# Patient Record
Sex: Female | Born: 1990 | Race: Black or African American | Hispanic: No | Marital: Single | Smoking: Never smoker
Health system: Southern US, Community
[De-identification: ages and names within clinical notes are randomized; demographics above are authoritative.]

## PROBLEM LIST (undated history)

## (undated) DIAGNOSIS — R569 Unspecified convulsions: Secondary | ICD-10-CM

## (undated) DIAGNOSIS — F410 Panic disorder [episodic paroxysmal anxiety] without agoraphobia: Secondary | ICD-10-CM

## (undated) HISTORY — DX: Panic disorder (episodic paroxysmal anxiety): F41.0

---
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
## 2009-05-28 ENCOUNTER — Emergency Department (HOSPITAL_COMMUNITY): Admission: EM | Admit: 2009-05-28 | Discharge: 2009-05-28 | Payer: Self-pay | Admitting: Emergency Medicine

## 2010-02-03 ENCOUNTER — Ambulatory Visit: Payer: Self-pay | Admitting: Family Medicine

## 2010-02-03 DIAGNOSIS — Z87898 Personal history of other specified conditions: Secondary | ICD-10-CM | POA: Insufficient documentation

## 2010-02-03 DIAGNOSIS — R634 Abnormal weight loss: Secondary | ICD-10-CM | POA: Insufficient documentation

## 2010-02-11 ENCOUNTER — Encounter: Payer: Self-pay | Admitting: Family Medicine

## 2010-02-13 ENCOUNTER — Encounter: Payer: Self-pay | Admitting: Family Medicine

## 2010-02-26 ENCOUNTER — Encounter: Payer: Self-pay | Admitting: Family Medicine

## 2010-03-05 LAB — CONVERTED CEMR LAB
BUN: 15 mg/dL (ref 6–23)
Basophils Absolute: 0 10*3/uL (ref 0.0–0.1)
CO2: 23 meq/L (ref 19–32)
Cholesterol: 143 mg/dL (ref 0–200)
Creatinine, Ser: 0.88 mg/dL (ref 0.40–1.20)
Eosinophils Absolute: 0 10*3/uL (ref 0.0–0.7)
Glucose, Bld: 78 mg/dL (ref 70–99)
HDL: 72 mg/dL (ref >39)
LDL Cholesterol: 61 mg/dL (ref 0–99)
Lymphs Abs: 1.6 10*3/uL (ref 0.7–4.0)
MCHC: 32.9 g/dL (ref 30.0–36.0)
MCV: 89.5 fL (ref 78.0–100.0)
Monocytes Absolute: 0.5 10*3/uL (ref 0.1–1.0)
Monocytes Relative: 13 % — ABNORMAL HIGH (ref 3–12)
Platelets: 159 10*3/uL (ref 150–400)
RBC: 4.75 M/uL (ref 3.87–5.11)
VLDL: 10 mg/dL (ref 0–40)
Vit D, 25-Hydroxy: 23 ng/mL — ABNORMAL LOW (ref 30–89)
WBC: 4.1 10*3/uL (ref 4.0–10.5)

## 2010-07-22 ENCOUNTER — Ambulatory Visit: Payer: Self-pay | Admitting: Family Medicine

## 2010-07-22 ENCOUNTER — Other Ambulatory Visit
Admission: RE | Admit: 2010-07-22 | Discharge: 2010-07-22 | Payer: Self-pay | Source: Home / Self Care | Admitting: Family Medicine

## 2010-07-22 DIAGNOSIS — N760 Acute vaginitis: Secondary | ICD-10-CM

## 2010-07-23 ENCOUNTER — Encounter: Payer: Self-pay | Admitting: Family Medicine

## 2010-07-24 ENCOUNTER — Encounter: Payer: Self-pay | Admitting: Family Medicine

## 2010-07-24 LAB — CONVERTED CEMR LAB: Pap Smear: NEGATIVE

## 2010-07-28 LAB — CONVERTED CEMR LAB
Chlamydia, DNA Probe: POSITIVE — AB
GC Probe Amp, Genital: NEGATIVE

## 2010-07-31 ENCOUNTER — Ambulatory Visit: Payer: Self-pay | Admitting: Family Medicine

## 2010-09-13 ENCOUNTER — Encounter: Payer: Self-pay | Admitting: Pediatrics

## 2010-09-22 NOTE — Assessment & Plan Note (Signed)
Summary: phy/slj   Vital Signs:  Patient profile:   20 year old female Menstrual status:  regular LMP:     06/25/2010 Height:      64.75 inches Weight:      111.25 pounds BMI:     18.72 O2 Sat:      98 % on Room air Pulse rate:   68 / minute Pulse rhythm:   regular Resp:     16 per minute BP sitting:   90 / 60  (left arm)  Vitals Entered By: Adella Hare LPN (July 22, 2010 1:06 PM)  O2 Flow:  Room air CC: physical Is Patient Diabetic? No Pain Assessment Patient in pain? no       Vision Screening:Left eye w/o correction: 20 / 20 Right Eye w/o correction: 20 / 20 Both eyes w/o correction:  20/ 20        Vision Entered By: Adella Hare LPN (July 22, 2010 2:04 PM) LMP (date): 06/25/2010     Menstrual Status regular Enter LMP: 06/25/2010   CC:  physical.  History of Present Illness: Reports  that she has been doing fairly well. Denies recent fever or chills. Denies sinus pressure, nasal congestion , ear pain or sore throat. Denies chest congestion, or cough productive of sputum. Denies chest pain, palpitations, PND, orthopnea or leg swelling. Denies abdominal pain, nausea, vomitting, diarrhea or constipation. Denies change in bowel movements or bloody stool. Denies dysuria , frequency, incontinence or hesitancy.c/o malodoros vag d/c and intermittent pelvic pain. Denies  joint pain, swelling, or reduced mobility. Denies headaches, vertigo, seizures. Denies depression, anxiety or insomnia. Denies  rash, lesions, or itch.     Current Medications (verified): 1)  Tri-Sprintec 0.18/0.215/0.25 Mg-35 Mcg Tabs (Norgestim-Eth Estrad Triphasic) .... Use As Directed  Allergies (verified): No Known Drug Allergies  Review of Systems      See HPI General:  Denies chills, fatigue, and fever. Eyes:  Denies blurring, discharge, double vision, eye pain, and red eye. GU:  Complains of discharge. Psych:  Denies anxiety and depression. Endo:  Denies cold  intolerance, excessive hunger, excessive thirst, and excessive urination. Heme:  Denies abnormal bruising, bleeding, enlarge lymph nodes, and fevers. Allergy:  Denies hives or rash, itching eyes, persistent infections, and seasonal allergies.  Physical Exam  General:  Well-developed,well-nourished,in no acute distress; alert,appropriate and cooperative throughout examination Head:  Normocephalic and atraumatic without obvious abnormalities. No apparent alopecia or balding. Eyes:  No corneal or conjunctival inflammation noted. EOMI. Perrla. Funduscopic exam benign, without hemorrhages, exudates or papilledema. Vision grossly normal. Ears:  External ear exam shows no significant lesions or deformities.  Otoscopic examination reveals clear canals, tympanic membranes are intact bilaterally without bulging, retraction, inflammation or discharge. Hearing is grossly normal bilaterally. Nose:  External nasal examination shows no deformity or inflammation. Nasal mucosa are pink and moist without lesions or exudates. Mouth:  Oral mucosa and oropharynx without lesions or exudates.  Teeth in good repair. Neck:  No deformities, masses, or tenderness noted. Chest Wall:  No deformities, masses, or tenderness noted. Breasts:  No mass, nodules, thickening, tenderness, bulging, retraction, inflamation, nipple discharge or skin changes noted.   Lungs:  Normal respiratory effort, chest expands symmetrically. Lungs are clear to auscultation, no crackles or wheezes. Heart:  Normal rate and regular rhythm. S1 and S2 normal without gallop, murmur, click, rub or other extra sounds. Abdomen:  Bowel sounds positive,abdomen soft and non-tender without masses, organomegaly or hernias noted. Genitalia:  Normal introitus for age, no external  lesions, foul smelling vaginal d/c, mucosa pink and moist, no vaginal or cervical lesions, no vaginal atrophy, no friaility or hemorrhage, normal uterus size and position, no adnexal masses  or tenderness Msk:  No deformity or scoliosis noted of thoracic or lumbar spine.   Pulses:  R and L carotid,radial,femoral,dorsalis pedis and posterior tibial pulses are full and equal bilaterally Extremities:  No clubbing, cyanosis, edema, or deformity noted with normal full range of motion of all joints.   Neurologic:  No cranial nerve deficits noted. Station and gait are normal. Plantar reflexes are down-going bilaterally. DTRs are symmetrical throughout. Sensory, motor and coordinative functions appear intact. Skin:  Intact without suspicious lesions or rashes Cervical Nodes:  No lymphadenopathy noted Axillary Nodes:  No palpable lymphadenopathy Inguinal Nodes:  No significant adenopathy Psych:  Cognition and judgment appear intact. Alert and cooperative with normal attention span and concentration. No apparent delusions, illusions, hallucinations   Impression & Recommendations:  Problem # 1:  UNSPECIFIED VAGINITIS AND VULVOVAGINITIS (ICD-616.10) Assessment Comment Only  Her updated medication list for this problem includes:    Doxycycline Hyclate 100 Mg Caps (Doxycycline hyclate) .Marland Kitchen... Take 1 capsule by mouth two times a day  Orders: T-Wet Prep by Molecular Probe [phone]) T-Chlamydia & GC Probe, Genital ([id number]) saf sex discussed and condom use, denies crrent activity  Problem # 2:  Preventive Health Care (ICD-V70.0) Assessment: Comment Only  contraception prescribed,pap sent  Complete Medication List: 1)  Tri-sprintec 0.18/0.215/0.25 Mg-35 Mcg Tabs (Norgestim-eth estrad triphasic) .... Use as directed 2)  Doxycycline Hyclate 100 Mg Caps (Doxycycline hyclate) .... Take 1 capsule by mouth two times a day  Other Orders: Pap Smear (66440) Tdap => 75yrs IM (34742) Admin 1st Vaccine (59563)  Patient Instructions: 1)  F/U 4 to 5 months  for gardasil #3 2)  It is important that you exercise regularly at least 20 minutes 5 times a week. If you develop chest pain,  have severe difficulty breathing, or feel very tired , stop exercising immediately and seek medical attention. 3)  If you could be exposed to sexually transmitted diseases, you should use a condom. 4)  If you are having sex and you or your partner don't want a child, use contraception. 5)  You will get gardasil #2  today, and tdAP  Prescriptions: DOXYCYCLINE HYCLATE 100 MG CAPS (DOXYCYCLINE HYCLATE) Take 1 capsule by mouth two times a day  #14 x 0   Entered and Authorized by:   Syliva Overman MD   Signed by:   Syliva Overman MD on 07/24/2010   Method used:   Historical   RxID:   [id number] TRI-SPRINTEC 0.18/0.215/0.25 MG-35 MCG TABS (NORGESTIM-ETH ESTRAD TRIPHASIC) Use as directed  #28 Tablet x 11   Entered by:   Adella Hare LPN   Authorized by:   Syliva Overman MD   Signed by:   Adella Hare LPN on 60/63/0160   Method used:   Electronically to        [facility] (retail)       [address]       [place], [place]  [id number]       Ph: [phone]       Fax: [phone]   RxID:   [id number]    Orders Added: 1)  Est. Patient 18-39 years [99395] 2)  T-Wet Prep by Molecular Probe [phone] 3)  T-Chlamydia & GC Probe, Genital [87491/[id number]] 4)  Pap Smear [88150] 5)  Tdap => 73yrs IM [90715] 6)  Admin 1st Vaccine [25366]   Immunizations Administered:  Tetanus Vaccine:    Vaccine Type: Tdap    Site: right deltoid    Mfr: GlaxoSmithKline    Dose: 0.5 ml    Route: IM    Given by: Adella Hare LPN    Exp. Date: 06/11/2012    Lot #: [id number]    VIS given: 07/10/08 version given July 22, 2010.   Immunizations Administered:  Tetanus Vaccine:    Vaccine Type: Tdap    Site: right deltoid    Mfr: GlaxoSmithKline    Dose: 0.5 ml    Route: IM    Given by: Adella Hare LPN    Exp. Date: 06/11/2012    Lot #: [id number]    VIS given: 07/10/08 version given July 22, 2010.

## 2010-09-22 NOTE — Letter (Signed)
Summary: medical release  medical release   Imported By: Lind Guest 02/04/2010 09:11:11  _____________________________________________________________________  External Attachment:    Type:   Image     Comment:   External Document

## 2010-09-22 NOTE — Progress Notes (Signed)
[facility]   Imported By: Lind Guest 03/04/2010 08:06:26  _____________________________________________________________________  External Attachment:    Type:   Image     Comment:   External Document

## 2010-09-22 NOTE — Letter (Signed)
Summary: [facility]  [facility]   Imported By: Lind Guest 02/13/2010 10:14:56  _____________________________________________________________________  External Attachment:    Type:   Image     Comment:   External Document

## 2010-09-22 NOTE — Progress Notes (Signed)
Summary: DR. Ollen Barges  DR. Ollen Barges   Imported By: Lind Guest 02/10/2010 08:53:13  _____________________________________________________________________  External Attachment:    Type:   Image     Comment:   External Document

## 2010-09-22 NOTE — Letter (Signed)
Summary: Work Excuse  [facility]  [address]   [place], [place] [address]   Phone: [phone]  Fax: [phone]    Today's Date: July 22, 2010  Name of Patient: Mariah Fisher  The above named patient had a medical visit today at:  am / pm.  Please take this into consideration when reviewing the time away from work/school.    Special Instructions:  [ * ] None  [  ] To be off the remainder of today, returning to the normal work / school schedule tomorrow.  [  ] To be off until the next scheduled appointment on ______________________.  [  ] Other ________________________________________________________________ ________________________________________________________________________   Sincerely yours,   Milus Mallick. Lodema Hong, MD

## 2010-09-22 NOTE — Assessment & Plan Note (Signed)
Summary: NEW PATIENT   Vital Signs:  Patient profile:   20 year old female Height:      64.75 inches Weight:      106.75 pounds BMI:     17.97 Pulse rate:   61 / minute Pulse rhythm:   regular Resp:     16 per minute BP sitting:   90 / 60  (left arm)  Vitals Entered By: Adella Hare LPN (February 03, 2010 11:13 AM) CC: new patient Is Patient Diabetic? No Pain Assessment Patient in pain? no        CC:  new patient.  History of Present Illness: This is  a new pt evaluation for this young lady who reports 2 syncopal episodes in the past 1 month, once was while drawing blood, and the other time was on the job when she asked for a break to eat having not eaten for 8 hrs , she went to the bathroom and passed out. In the past she has had full neurology eval for seizures was started on paxil and reportedly became suicidal on the drug and thereafter discontinued it. This was in 2009, no other seizure activity since, but has a dx from neurologuy of gAD, though she denies current symptoms  Poor apertite with weight loss noted by her mother who accmpanies her. She denies abdominal apin , nausea, vommiting diarreah or constipation. she denies any recent fever or chills, head or chest congestion, dysuria or frequency.   Preventive Screening-Counseling & Management  Alcohol-Tobacco     Smoking Status: never  Caffeine-Diet-Exercise     Does Patient Exercise: no      Drug Use:  no.    Current Medications (verified): 1)  None  Allergies (verified): No Known Drug Allergies  Past History:  Past Medical History: panic disorder  started at age 32  Past Surgical History: none  Family History: mother- htn father- no history brother and sister- asthma  Social History: college in the fall- undecided unemployed single no dependents Never Smoked Alcohol use-no Drug use-no Regular exercise-no Smoking Status:  never Drug Use:  no Does Patient Exercise:  no  Review of  Systems      See HPI General:  Complains of fatigue and weight loss; denies chills and fever. Eyes:  Denies blurring and discharge. ENT:  Denies hoarseness, nasal congestion, sinus pressure, and sore throat. CV:  Complains of fatigue and lightheadness; denies chest pain or discomfort, palpitations, shortness of breath with exertion, and swelling of feet. Resp:  Denies cough, sputum productive, and wheezing. GI:  Complains of loss of appetite; denies abdominal pain, constipation, diarrhea, nausea, and vomiting. GU:  Denies abnormal vaginal bleeding, discharge, dysuria, and urinary frequency; needs to resume contraceptives as well as needs pap. MS:  Denies joint pain and stiffness. Derm:  Denies itching and rash. Neuro:  Denies headaches, poor balance, and tremors. Psych:  Complains of anxiety; denies depression; h/o GAD/panic, denies current symptoms. Endo:  Complains of weight change; denies excessive thirst, excessive urination, and heat intolerance. Heme:  Denies abnormal bruising and bleeding. Allergy:  Denies hives or rash and itching eyes.  Physical Exam  General:  Well-developed,adequately nourished,in no acute distress; alert,appropriate and cooperative throughout examination HEENT: No facial asymmetry,  EOMI, No sinus tenderness, TM's Clear, oropharynx  pink and moist.   Chest: Clear to auscultation bilaterally.  CVS: S1, S2, No murmurs, No S3.   Abd: Soft, Nontender.  MS: Adequate ROM spine, hips, shoulders and knees.  Ext: No edema.  CNS: CN 2-12 intact, power tone and sensation normal throughout.   Skin: Intact, no visible lesions or rashes.  Psych: Good eye contact, normal affect.  Memory intact, not anxious or depressed appearing.    Impression & Recommendations:  Problem # 1:  WEIGHT LOSS (ICD-783.21) Assessment Comment Only  Orders: T-Basic Metabolic Panel [phone]) T-CBC w/Diff [phone]) T-TSH [phone]) T-Vitamin D (25-Hydroxy) [phone]) pt  referred for nutritional counselling re impt of healthy diet and good nutrition  Problem # 2:  SYNCOPE (ICD-780.2) Assessment: Comment Only  Orders: T-CBC w/Diff ([id number]) Cardiology Referral (Cardiology), one episode is vasovagal by history , and the 2nd likely related to hypoglycemia, pt referred for basic card eval, exaM IN THE OFFICE IS NORMAL, NO VALVULAR ABNORMALITIES HEARD ON EXAM  Problem # 3:  Preventive Health Care (ICD-V70.0) Assessment: Comment Only Gardasil #1 administered. Counselled re use of condoms and contraception , and script provided.  Complete Medication List: 1)  Tri-sprintec 0.18/0.215/0.25 Mg-35 Mcg Tabs (Norgestim-eth estrad triphasic) .... Use as directed  Other Orders: T-Lipid Profile ([id number]) HPV Vaccine - 3 sched doses - IM (59563) Admin 1st Vaccine (87564) Admin 1st Vaccine [facility]) [phone])  Patient Instructions: 1)  cPE in  end August or early sept fopr pap. 2)  BMP prior to visit, ICD-9: 3)  Lipid Panel prior to visit, ICD-9:   fasting asap 4)  TSH prior to visit, ICD-9: 5)  CBC w/ Diff prior to visit, ICD-9: 6)  vit D 7)  You will be referred to cardiology as well as for nutrtional counselling  8)  If you could be exposed to sexually transmitted diseases, you should use a condom. 9)  If you are having sex and you or your partner don't want a child, use contraception. Prescriptions: TRI-SPRINTEC 0.18/0.215/0.25 MG-35 MCG TABS (NORGESTIM-ETH ESTRAD TRIPHASIC) Use as directed  #1 x 3   Entered by:   Adella Hare LPN   Authorized by:   Syliva Overman MD   Signed by:   Adella Hare LPN on 88/41/6606   Method used:   Electronically to        [facility] (retail)       [address]       [place], [place]  [id number]       Ph: [phone]       Fax: [phone]   RxID:   [id number] TRI-SPRINTEC 0.18/0.215/0.25 MG-35 MCG TABS (NORGESTIM-ETH ESTRAD TRIPHASIC) Use as directed  #1 x 3   Entered and  Authorized by:   Syliva Overman MD   Signed by:   Syliva Overman MD on 02/03/2010   Method used:   Printed then faxed to ...         RxID:   [id number]    HPV # 1    Vaccine Type: Gardasil    Site: right deltoid    Mfr: Merck    Dose: 0.5 ml    Route: IM    Given by: Adella Hare LPN    Exp. Date: 06/06/2011    Lot #: [id number]    VIS given: 09/24/05 version given February 03, 2010.

## 2010-09-22 NOTE — Letter (Signed)
Summary: Pap Smear, Normal Letter, [facility]  [address]   [place], [place] [address]   Phone: [phone]  Fax: [phone]          July 24, 2010    Dear: Estelle Grumbles    I am pleased to notify you that your PAP smear was normal.  You will need your next PAP smear in:     ____ 3 Months    ____ 6 Months    ____ 12 Months    Please contact our office for results of other testing done at your last office visit.    Sincerely,     [facility]

## 2010-09-22 NOTE — Letter (Signed)
Summary: communicable disease branch  communicable disease branch   Imported By: Lind Guest 07/31/2010 16:56:26  _____________________________________________________________________  External Attachment:    Type:   Image     Comment:   External Document

## 2010-09-24 NOTE — Assessment & Plan Note (Signed)
Summary: Kirstie Mirza.INJ  Nurse Visit   Vital Signs:  Patient profile:   20 year old female Menstrual status:  regular Height:      64.75 inches Weight:      113.25 pounds BP sitting:   98 / 68  Vitals Entered By: Adella Hare LPN (July 30, 2010 3:11 PM)   Allergies: No Known Drug Allergies  Immunizations Administered:  HPV # 2:    Vaccine Type: Gardasil    Site: right deltoid    Mfr: Merck    Dose: 0.5 ml    Route: IM    Given by: Adella Hare LPN    Exp. Date: 02/10/2013    Lot #: [id number]    VIS given: 12/23/09 version given July 30, 2010.  Orders Added: 1)  HPV Vaccine - 3 sched doses - IM [90649] 2)  Admin 1st Vaccine [90471]  gardasil given with no adverse side efects

## 2010-10-15 ENCOUNTER — Encounter: Payer: Self-pay | Admitting: Family Medicine

## 2010-10-15 ENCOUNTER — Ambulatory Visit (INDEPENDENT_AMBULATORY_CARE_PROVIDER_SITE_OTHER): Payer: BC Managed Care – PPO | Admitting: Family Medicine

## 2010-10-15 DIAGNOSIS — R634 Abnormal weight loss: Secondary | ICD-10-CM

## 2010-10-15 DIAGNOSIS — R55 Syncope and collapse: Secondary | ICD-10-CM

## 2010-10-15 DIAGNOSIS — Z23 Encounter for immunization: Secondary | ICD-10-CM

## 2010-10-16 ENCOUNTER — Telehealth: Payer: Self-pay | Admitting: Family Medicine

## 2010-10-20 ENCOUNTER — Other Ambulatory Visit: Payer: Self-pay | Admitting: Family Medicine

## 2010-10-20 DIAGNOSIS — R55 Syncope and collapse: Secondary | ICD-10-CM

## 2010-10-20 NOTE — Letter (Signed)
Summary: release of medical information  release of medical information   Imported By: Lind Guest 10/15/2010 10:28:08  _____________________________________________________________________  External Attachment:    Type:   Image     Comment:   External Document

## 2010-10-20 NOTE — Progress Notes (Signed)
  Phone Note Call from Patient   Caller: Patient Summary of Call: patient states she wants a refill on the antibiotic you gave her a while back, states she did not take as prescribed and wants to ensure she is okay Initial call taken by: Adella Hare LPN,  October 16, 2010 9:48 AM  Follow-up for Phone Call        please notify the patient  of the medication  The new script is entered historically, please send after speaking with the patient.  Follow-up by: Syliva Overman MD,  October 16, 2010 3:29 PM  Additional Follow-up for Phone Call Additional follow up Details #1::        med sent, patient aware Additional Follow-up by: Adella Hare LPN,  October 16, 2010 4:04 PM    New/Updated Medications: DOXYCYCLINE HYCLATE 100 MG TABS (DOXYCYCLINE HYCLATE) Take 1 tablet by mouth two times a day Prescriptions: DOXYCYCLINE HYCLATE 100 MG TABS (DOXYCYCLINE HYCLATE) Take 1 tablet by mouth two times a day  #14 x 0   Entered by:   Adella Hare LPN   Authorized by:   Syliva Overman MD   Signed by:   Adella Hare LPN on 16/05/9603   Method used:   Electronically to        [facility] (retail)       [address]       [place], [place]  [id number]       Ph: [phone]       Fax: [phone]   RxID:   [id number] DOXYCYCLINE HYCLATE 100 MG TABS (DOXYCYCLINE HYCLATE) Take 1 tablet by mouth two times a day  #14 x 0   Entered and Authorized by:   Syliva Overman MD   Signed by:   Syliva Overman MD on 10/16/2010   Method used:   Historical   RxID:   [id number]

## 2010-10-23 ENCOUNTER — Ambulatory Visit (HOSPITAL_COMMUNITY): Admission: RE | Admit: 2010-10-23 | Payer: BC Managed Care – PPO | Source: Ambulatory Visit

## 2010-10-29 ENCOUNTER — Encounter: Payer: Self-pay | Admitting: Family Medicine

## 2010-10-29 NOTE — Assessment & Plan Note (Signed)
Summary: office visit needs to sign paper for mother   Vital Signs:  Patient profile:   20 year old female Menstrual status:  regular Height:      64.5 inches Weight:      109 pounds BMI:     18.49 O2 Sat:      98 % Pulse rate:   58 / minute Pulse rhythm:   regular Resp:     16 per minute BP sitting:   98 / 64  (left arm) Cuff size:   regular  Vitals Entered By: Everitt Amber LPN (October 15, 2010 10:01 AM) CC: Follow up visit, c/o head and neck hurting since she fell Monday during a panic attack   Vision Screening:Left eye w/o correction: 20 / 20 Right Eye w/o correction: 20 / 25 Both eyes w/o correction:  20/ 20        Vision Entered By: Adella Hare LPN (October 16, 2010 9:46 AM)   CC:  Follow up visit and c/o head and neck hurting since she fell Monday during a panic attack .  History of Present Illness: Passed out 3 days ago on the job, states she had eaten  feel to the floor eyes rolling , states she was jerking  and she was sweaty after, this was witnessed, no incontionenec of stool or urine, States she had an "aura", lost feeling in both legs. States she is having blurred vision in the past 3 days. now experiencing head and neck pain. She states she has been eaing regularly. she has been evauatedby cardiology in the past 6months for this complaint and this was negative.  Current Medications (verified): 1)  Tri-Sprintec 0.18/0.215/0.25 Mg-35 Mcg Tabs (Norgestim-Eth Estrad Triphasic) .... Use As Directed  Allergies (verified): No Known Drug Allergies  Review of Systems      See HPI General:  Complains of weakness and weight loss; denies chills, fatigue, and fever. Eyes:  Complains of blurring; denies discharge and itching. ENT:  Denies hoarseness and nasal congestion. CV:  Denies chest pain or discomfort, palpitations, and swelling of feet. Resp:  Denies cough and sputum productive. GI:  Denies abdominal pain, constipation, diarrhea, nausea, and  vomiting. GU:  Denies dysuria and urinary frequency. MS:  Denies joint pain and stiffness. Derm:  Denies itching and rash. Neuro:  Complains of falling down, headaches, and weakness; denies inability to speak, memory loss, numbness, poor balance, and sensation of room spinning. Endo:  Denies cold intolerance and excessive hunger. Heme:  Denies abnormal bruising and bleeding. Allergy:  Denies hives or rash and itching eyes.  Physical Exam  General:  Well-developed,well-nourished,in no acute distress; alert,appropriate and cooperative throughout examination HEENT: No facial asymmetry,  EOMI, No sinus tenderness, TM's Clear, oropharynx  pink and moist.   Chest: Clear to auscultation bilaterally.  CVS: S1, S2, No murmurs, No S3.   Abd: Soft, Nontender.  MS: Adequate ROM spine, hips, shoulders and knees.  Ext: No edema.   CNS: CN 2-12 intact, power tone and sensation normal throughout.   Skin: Intact, no visible lesions or rashes.  Psych: Good eye contact, normal affect.  Memory intact, not anxious or depressed appearing.    Impression & Recommendations:  Problem # 1:  SYNCOPE (ICD-780.2) Assessment Comment Only  Orders: Neurology Referral (Neuro)Future Orders: Radiology Referral (Radiology) ... 10/20/2010  Problem # 2:  WEIGHT LOSS (ICD-783.21) Assessment: Deteriorated Pt encouraged to eat regularly, and increase her caloric intake, since she has lost weight  Complete Medication List: 1)  Tri-sprintec  0.18/0.215/0.25 Mg-35 Mcg Tabs (Norgestim-eth estrad triphasic) .... Use as directed  Other Orders: HPV Vaccine - 3 sched doses - IM (78295) Admin 1st Vaccine (62130)  Patient Instructions: 1)  Please schedule a follow-up appointment in 4 months. 2)  You are being referred to neurologist as well as for a brain scan. 3)  Vision screen is normal   Orders Added: 1)  Est. Patient Level IV [86578] 2)  Neurology Referral [Neuro] 3)  HPV Vaccine - 3 sched doses - IM  [90649] 4)  Admin 1st Vaccine [90471] 5)  Radiology Referral [Radiology]   Immunizations Administered:  HPV # 3:    Vaccine Type: Gardasil    Site: right deltoid    Mfr: Merck    Dose: 0.5 ml    Route: IM    Given by: Adella Hare LPN    Exp. Date: 02/10/2013    Lot #: [id number]    VIS given: 12/23/09 version given October 15, 2010.   Immunizations Administered:  HPV # 3:    Vaccine Type: Gardasil    Site: right deltoid    Mfr: Merck    Dose: 0.5 ml    Route: IM    Given by: Adella Hare LPN    Exp. Date: 02/10/2013    Lot #: [id number]    VIS given: 12/23/09 version given October 15, 2010.

## 2011-01-05 NOTE — Procedures (Signed)
EEG NUMBER:  [id number].   HISTORY:  The patient is a 20 year old with episodes of body and head  twitching with weakness.  She knows what is going on but is unable to  respond.  She can hear people talking to her.  The study is being done  to look for seizures versus anxiety attacks.  The patient had an event  during the evaluation, which started after she began to hyperventilate.  (604.54,098.0)   PROCEDURE:  The tracing is carried out on a 32-channel digital Cadwell  recorder reformatted into 16 channel montages with one devoted to EKG.  The patient was awake and alert during the recording.  Medications  include Ativan.  The International 10/20 system of lead placement was  used.   DESCRIPTION OF FINDINGS:  The dominant frequency is a 11-Hz, 15-  microvolt activity that is well-regulated.  Background activity is 20 20  Hz, under 10-microvolt beta-range activity.   Photic stimulation induced a driving response at 1, 3 and 7-17 Hz.  Hyperventilation was started and caused little change but then the  patient began experience twitching of her fingers which increased  amplitude, then her head began to twitch, her mouth opened.  She was  asked to grip and gave poor effort.  She said it was hard to lift both  legs and complained that they were heavy and her head felt heavy.  This  all occurred during a time when the background EEG was normal.   EKG showed a regular sinus rhythm with ventricular response of 54 beats  per minute.   IMPRESSION:  Normal waking record.  Behaviors are nonepileptic in  nature.      Mariah Fisher. Sharene Skeans, M.D.  Electronically Signed     [id number]  D:  05/04/2007 17:39:38  T:  05/05/2007 12:01:47  Job #:  [id number]

## 2011-02-09 ENCOUNTER — Encounter: Payer: Self-pay | Admitting: Family Medicine

## 2011-02-11 ENCOUNTER — Encounter: Payer: Self-pay | Admitting: Family Medicine

## 2011-02-11 ENCOUNTER — Ambulatory Visit: Payer: BC Managed Care – PPO | Admitting: Family Medicine

## 2011-04-16 ENCOUNTER — Encounter: Payer: Self-pay | Admitting: Family Medicine

## 2011-04-19 ENCOUNTER — Ambulatory Visit (INDEPENDENT_AMBULATORY_CARE_PROVIDER_SITE_OTHER): Payer: BC Managed Care – PPO | Admitting: Family Medicine

## 2011-04-19 ENCOUNTER — Encounter: Payer: Self-pay | Admitting: Family Medicine

## 2011-04-19 VITALS — BP 94/60 | HR 61 | Ht 64.75 in | Wt 109.0 lb

## 2011-04-19 DIAGNOSIS — R636 Underweight: Secondary | ICD-10-CM

## 2011-04-19 DIAGNOSIS — Z13220 Encounter for screening for lipoid disorders: Secondary | ICD-10-CM

## 2011-04-19 DIAGNOSIS — R5383 Other fatigue: Secondary | ICD-10-CM

## 2011-04-19 DIAGNOSIS — R634 Abnormal weight loss: Secondary | ICD-10-CM

## 2011-04-19 DIAGNOSIS — E559 Vitamin D deficiency, unspecified: Secondary | ICD-10-CM

## 2011-04-19 DIAGNOSIS — R55 Syncope and collapse: Secondary | ICD-10-CM

## 2011-04-19 MED ORDER — VITAMIN D3 1.25 MG (50000 UT) PO CAPS: 50000.0000 [IU] | ORAL_CAPSULE | ORAL | Status: DC

## 2011-04-19 NOTE — Patient Instructions (Signed)
CPE in 3 months.  pls commit to eating breakfast daily, eg 2 cups yogurt.  Start vit D capsules  LABWORK  NEEDS TO BE DONE BETWEEN 3 TO 7 DAYS BEFORE YOUR NEXT SCEDULED  VISIT.  THIS WILL IMPROVE THE QUALITY OF YOUR CARE.

## 2011-05-01 NOTE — Assessment & Plan Note (Signed)
Still has poor eating habits and has not gained weight, counseled re the importance of making changes to improve health

## 2011-05-01 NOTE — Progress Notes (Signed)
  Subjective:    Patient ID: Mariah Fisher, female    DOB: Nov 09, 1990, 20 y.o.   MRN: [id number]  HPI The PT is here for follow up and re-evaluation of chronic medical conditions, medication management and review of any available recent lab and radiology data.  Preventive health is updated, specifically  Cancer screening and Immunization.   Questions or concerns regarding consultations or procedures which the PT has had in the interim are  addressed. The PT denies any adverse reactions to current medications since the last visit.  There are no new concerns.  There are no specific complaints      Review of Systems Denies recent fever or chills. Denies sinus pressure, nasal congestion, ear pain or sore throat. Denies chest congestion, productive cough or wheezing. Denies chest pains, palpitations and leg swelling Denies abdominal pain, nausea, vomiting,diarrhea or constipation.   Denies dysuria, frequency, hesitancy or incontinence. Denies joint pain, swelling and limitation in mobility. Denies headaches, seizures, numbness, or tingling. Denies depression, anxiety or insomnia. Denies skin break down or rash.        Objective:   Physical Exam Patient alert and oriented and in no cardiopulmonary distress.  HEENT: No facial asymmetry, EOMI, no sinus tenderness,  oropharynx pink and moist.  Neck supple no adenopathy.  Chest: Clear to auscultation bilaterally.  CVS: S1, S2 no murmurs, no S3.  ABD: Soft non tender. Bowel sounds normal.  Ext: No edema  MS: Adequate ROM spine, shoulders, hips and knees.  Skin: Intact, no ulcerations or rash noted.  Psych: Good eye contact, normal affect. Memory intact not anxious or depressed appearing.  CNS: CN 2-12 intact, power, tone and sensation normal throughout.        Assessment & Plan:

## 2011-05-01 NOTE — Assessment & Plan Note (Signed)
No episode since last visit

## 2011-05-01 NOTE — Assessment & Plan Note (Signed)
Needs prescription med and will rept labs prior to next visit

## 2011-07-12 ENCOUNTER — Encounter: Payer: Self-pay | Admitting: Family Medicine

## 2011-07-22 ENCOUNTER — Encounter: Payer: Self-pay | Admitting: Family Medicine

## 2011-07-22 ENCOUNTER — Encounter: Payer: BC Managed Care – PPO | Admitting: Family Medicine

## 2011-11-03 ENCOUNTER — Other Ambulatory Visit: Payer: Self-pay | Admitting: Family Medicine

## 2012-03-03 ENCOUNTER — Encounter: Payer: Self-pay | Admitting: Family Medicine

## 2012-03-03 ENCOUNTER — Other Ambulatory Visit (HOSPITAL_COMMUNITY)
Admission: RE | Admit: 2012-03-03 | Discharge: 2012-03-03 | Disposition: A | Discharge status: Home / Self Care | Payer: BC Managed Care – PPO | Source: Ambulatory Visit | Attending: Family Medicine | Admitting: Family Medicine

## 2012-03-03 ENCOUNTER — Ambulatory Visit (INDEPENDENT_AMBULATORY_CARE_PROVIDER_SITE_OTHER): Payer: BC Managed Care – PPO | Admitting: Family Medicine

## 2012-03-03 VITALS — BP 110/60 | HR 85 | Resp 16 | Ht 64.75 in | Wt 110.4 lb

## 2012-03-03 DIAGNOSIS — N760 Acute vaginitis: Secondary | ICD-10-CM

## 2012-03-03 DIAGNOSIS — Z0000 Encounter for general adult medical examination without abnormal findings: Secondary | ICD-10-CM

## 2012-03-03 DIAGNOSIS — Z01419 Encounter for gynecological examination (general) (routine) without abnormal findings: Secondary | ICD-10-CM

## 2012-03-03 DIAGNOSIS — Z1159 Encounter for screening for other viral diseases: Secondary | ICD-10-CM | POA: Insufficient documentation

## 2012-03-03 MED ORDER — NORGESTIM-ETH ESTRAD TRIPHASIC 0.18/0.215/0.25 MG-35 MCG PO TABS: ORAL_TABLET | ORAL | Status: DC

## 2012-03-03 NOTE — Patient Instructions (Addendum)
CPE in 12 month  Pap  And swabs today.  Practice safe sex with condoms.   Use seat belts all the time  It is important that you exercise regularly at least 30 minutes 5 times a week. If you develop chest pain, have severe difficulty breathing, or feel very tired, stop exercising immediately and seek medical attention    A healthy diet is rich in fruit, vegetables and whole grains. Poultry fish, nuts and beans are a healthy choice for protein rather then red meat. A low sodium diet and drinking 64 ounces of water daily is generally recommended. Oils and sweet should be limited.

## 2012-03-04 DIAGNOSIS — Z0000 Encounter for general adult medical examination without abnormal findings: Secondary | ICD-10-CM | POA: Insufficient documentation

## 2012-03-04 NOTE — Assessment & Plan Note (Signed)
The importance of safe sex, with condom use, and contraception discussed and encouraged  Healthy diet and regular physical activity encouraged

## 2012-03-04 NOTE — Assessment & Plan Note (Signed)
Swabs sent will treat based on resiult

## 2012-03-04 NOTE — Progress Notes (Signed)
  Subjective:    Patient ID: Mariah Fisher, female    DOB: 1991/03/05, 21 y.o.   MRN: [id number]  HPI The PT is here for annual exam and re-evaluation of chronic medical conditions, medication management and review of any available recent lab and radiology data.  Preventive health is updated, specifically  Cancer screening and Immunization.   Questions or concerns regarding consultations or procedures which the PT has had in the interim are  Addressed.Recently treated for pharyngitis in urgent care approximately 3 weeks ago The PT denies any adverse reactions to current medications since the last visit.  There are no new concerns. Needs contraceptive pills. There are no specific complaints       Review of Systems See HPI Denies recent fever or chills. Denies sinus pressure, nasal congestion, ear pain or sore throat. Denies chest congestion, productive cough or wheezing. Denies chest pains, palpitations and leg swelling Denies abdominal pain, nausea, vomiting,diarrhea or constipation.   Denies dysuria, frequency, hesitancy or incontinence.c/o vaginal d/c Denies joint pain, swelling and limitation in mobility. Denies headaches, seizures, numbness, or tingling. Denies depression, anxiety or insomnia. Denies skin break down or rash.        Objective:   Physical Exam Pleasant well nourished female, alert and oriented x 3, in no cardio-pulmonary distress. Afebrile. HEENT No facial trauma or asymetry. Sinuses non tender.  EOMI, PERTL, fundoscopic exam is normal, no hemorhage or exudate.  External ears normal, tympanic membranes clear. Oropharynx moist, no exudate, fair  dentition. Neck: supple, no adenopathy,JVD or thyromegaly.No bruits.  Chest: Clear to ascultation bilaterally.No crackles or wheezes. Non tender to palpation  Breast: No asymetry,no masses. No nipple discharge or inversion. No axillary or supraclavicular adenopathy  Cardiovascular system; Heart sounds  normal,  S1 and  S2 ,no S3.  No murmur, or thrill. Apical beat not displaced Peripheral pulses normal.  Abdomen: Soft, non tender, no organomegaly or masses. No bruits. Bowel sounds normal. No guarding, tenderness or rebound.   GU: External genitalia normal. No lesions. Vaginal canal normal.No discharge. Uterus normal size, no adnexal masses, no cervical motion or adnexal tenderness.  Musculoskeletal exam: Full ROM of spine, hips , shoulders and knees. No deformity ,swelling or crepitus noted. No muscle wasting or atrophy.   Neurologic: Cranial nerves 2 to 12 intact. Power, tone ,sensation and reflexes normal throughout. No disturbance in gait. No tremor.  Skin: Intact, no ulceration, erythema , scaling or rash noted. Pigmentation normal throughout  Psych; Normal mood and affect. Judgement and concentration normal        Assessment & Plan:

## 2012-03-07 LAB — WET PREP BY MOLECULAR PROBE
Candida species: NEGATIVE
Trichomonas vaginosis: NEGATIVE

## 2012-03-07 LAB — GC/CHLAMYDIA PROBE AMP, GENITAL: GC Probe Amp, Genital: NEGATIVE

## 2012-03-16 ENCOUNTER — Other Ambulatory Visit: Payer: Self-pay

## 2012-03-16 MED ORDER — METRONIDAZOLE 500 MG PO TABS: 500.0000 mg | ORAL_TABLET | Freq: Two times a day (BID) | ORAL | Status: AC

## 2012-03-31 ENCOUNTER — Emergency Department (HOSPITAL_COMMUNITY): Payer: BC Managed Care – PPO

## 2012-03-31 ENCOUNTER — Emergency Department (HOSPITAL_COMMUNITY)
Admission: EM | Admit: 2012-03-31 | Discharge: 2012-03-31 | Disposition: A | Discharge status: Home / Self Care | Payer: BC Managed Care – PPO | Attending: Emergency Medicine | Admitting: Emergency Medicine

## 2012-03-31 ENCOUNTER — Encounter (HOSPITAL_COMMUNITY): Payer: Self-pay | Admitting: Emergency Medicine

## 2012-03-31 DIAGNOSIS — S4980XA Other specified injuries of shoulder and upper arm, unspecified arm, initial encounter: Secondary | ICD-10-CM | POA: Insufficient documentation

## 2012-03-31 DIAGNOSIS — S46909A Unspecified injury of unspecified muscle, fascia and tendon at shoulder and upper arm level, unspecified arm, initial encounter: Secondary | ICD-10-CM | POA: Insufficient documentation

## 2012-03-31 MED ORDER — LORAZEPAM 0.5 MG PO TABS: ORAL_TABLET | ORAL | Status: AC

## 2012-03-31 MED ORDER — IBUPROFEN 200 MG PO TABS
600.0000 mg | ORAL_TABLET | Freq: Once | ORAL | Status: AC
  Administered 2012-03-31: 600 mg via ORAL
  Filled 2012-03-31: qty 3

## 2012-03-31 MED ORDER — TETANUS-DIPHTH-ACELL PERTUSSIS 5-2.5-18.5 LF-MCG/0.5 IM SUSP
0.5000 mL | Freq: Once | INTRAMUSCULAR | Status: AC
  Administered 2012-03-31: 0.5 mL via INTRAMUSCULAR
  Filled 2012-03-31: qty 0.5

## 2012-03-31 MED ORDER — LORAZEPAM 1 MG PO TABS
1.0000 mg | ORAL_TABLET | Freq: Once | ORAL | Status: AC
  Administered 2012-03-31: 1 mg via ORAL
  Filled 2012-03-31: qty 1

## 2012-03-31 MED ORDER — IBUPROFEN 800 MG PO TABS: 800.0000 mg | ORAL_TABLET | Freq: Three times a day (TID) | ORAL | Status: AC

## 2012-03-31 MED ORDER — HYDROCODONE-ACETAMINOPHEN 5-325 MG PO TABS: 1.0000 | ORAL_TABLET | Freq: Once | ORAL | Status: DC
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
## 2012-03-31 NOTE — ED Provider Notes (Signed)
Medical screening examination/treatment/procedure(s) were performed by non-physician practitioner and as supervising physician I was immediately available for consultation/collaboration.  Sunnie Nielsen, MD 03/31/12 [phone]
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
## 2012-05-01 ENCOUNTER — Ambulatory Visit: Payer: BC Managed Care – PPO | Admitting: Family Medicine

## 2012-05-22 ENCOUNTER — Ambulatory Visit: Payer: BC Managed Care – PPO | Admitting: Family Medicine

## 2012-08-28 ENCOUNTER — Encounter: Payer: BC Managed Care – PPO | Admitting: Family Medicine

## 2013-01-16 ENCOUNTER — Telehealth: Payer: Self-pay | Admitting: Family Medicine

## 2013-01-16 ENCOUNTER — Other Ambulatory Visit: Payer: Self-pay

## 2013-01-16 MED ORDER — NORGESTIM-ETH ESTRAD TRIPHASIC 0.18/0.215/0.25 MG-35 MCG PO TABS: ORAL_TABLET | ORAL | Status: DC

## 2013-01-16 NOTE — Telephone Encounter (Signed)
Med refilled.

## 2013-03-02 ENCOUNTER — Encounter: Payer: BC Managed Care – PPO | Admitting: Family Medicine

## 2013-03-06 ENCOUNTER — Encounter: Payer: BC Managed Care – PPO | Admitting: Family Medicine

## 2014-01-31 ENCOUNTER — Other Ambulatory Visit: Payer: Self-pay | Admitting: Family Medicine

## 2014-06-10 ENCOUNTER — Other Ambulatory Visit: Payer: Self-pay

## 2014-06-10 MED ORDER — NORGESTIM-ETH ESTRAD TRIPHASIC 0.18/0.215/0.25 MG-35 MCG PO TABS: ORAL_TABLET | ORAL | Status: DC

## 2014-08-19 ENCOUNTER — Encounter: Payer: BC Managed Care – PPO | Admitting: Family Medicine

## 2014-09-03 ENCOUNTER — Other Ambulatory Visit: Payer: Self-pay | Admitting: Family Medicine

## 2014-09-04 ENCOUNTER — Telehealth: Payer: Self-pay

## 2014-09-04 NOTE — Telephone Encounter (Signed)
Denied. Not seen since 2013. Notified that must schedule appt

## 2014-09-06 ENCOUNTER — Other Ambulatory Visit: Payer: Self-pay

## 2014-09-06 MED ORDER — NORGESTIM-ETH ESTRAD TRIPHASIC 0.18/0.215/0.25 MG-35 MCG PO TABS: ORAL_TABLET | ORAL | Status: DC

## 2014-10-14 ENCOUNTER — Ambulatory Visit (INDEPENDENT_AMBULATORY_CARE_PROVIDER_SITE_OTHER): Payer: BLUE CROSS/BLUE SHIELD | Admitting: Family Medicine

## 2014-10-14 ENCOUNTER — Encounter: Payer: Self-pay | Admitting: Family Medicine

## 2014-10-14 ENCOUNTER — Other Ambulatory Visit (HOSPITAL_COMMUNITY)
Admission: RE | Admit: 2014-10-14 | Discharge: 2014-10-14 | Disposition: A | Discharge status: Home / Self Care | Payer: BLUE CROSS/BLUE SHIELD | Source: Ambulatory Visit | Attending: Family Medicine | Admitting: Family Medicine

## 2014-10-14 VITALS — BP 110/70 | HR 70 | Resp 18 | Ht 65.0 in | Wt 112.0 lb

## 2014-10-14 DIAGNOSIS — N760 Acute vaginitis: Secondary | ICD-10-CM

## 2014-10-14 DIAGNOSIS — Z23 Encounter for immunization: Secondary | ICD-10-CM

## 2014-10-14 DIAGNOSIS — Z01419 Encounter for gynecological examination (general) (routine) without abnormal findings: Secondary | ICD-10-CM | POA: Diagnosis not present

## 2014-10-14 DIAGNOSIS — Z113 Encounter for screening for infections with a predominantly sexual mode of transmission: Secondary | ICD-10-CM | POA: Diagnosis present

## 2014-10-14 DIAGNOSIS — Z0000 Encounter for general adult medical examination without abnormal findings: Secondary | ICD-10-CM | POA: Insufficient documentation

## 2014-10-14 MED ORDER — NORGESTIM-ETH ESTRAD TRIPHASIC 0.18/0.215/0.25 MG-35 MCG PO TABS: ORAL_TABLET | ORAL | Status: DC

## 2014-10-14 NOTE — Assessment & Plan Note (Signed)

## 2014-10-14 NOTE — Progress Notes (Signed)
   Subjective:    Patient ID: Mariah Fisher, female    DOB: 06/09/1991, 24 y.o.   MRN: [id number]  HPI Patient is in for annual physical  exam. Feels well, no health concerns Needs flu vaccine and decides to get this today, which is good  Review of Systems See HPI     Objective:   Physical Exam BP 110/70 mmHg  Pulse 70  Resp 18  Ht 5\' 5"  (1.651 m)  Wt 112 lb (50.803 kg)  BMI 18.64 kg/m2  SpO2 97%  LMP 09/24/2014  Pleasant well nourished female, alert and oriented x 3, in no cardio-pulmonary distress. Afebrile. HEENT No facial trauma or asymetry. Sinuses non tender.  Extra occullar muscles intact, pupils equally reactive to light. External ears normal, tympanic membranes clear. Oropharynx moist, no exudate, good dentition. Neck: supple, no adenopathy,JVD or thyromegaly.No bruits.  Chest: Clear to ascultation bilaterally.No crackles or wheezes. Non tender to palpation  Breast: No asymetry,no masses or lumps. No tenderness. No nipple discharge or inversion. No axillary or supraclavicular adenopathy  Cardiovascular system; Heart sounds normal,  S1 and  S2 ,no S3.  No murmur, or thrill. Apical beat not displaced Peripheral pulses normal.  Abdomen: Soft, non tender, no organomegaly or masses. No bruits. Bowel sounds normal. No guarding, tenderness or rebound.   GU: External genitalia normal female genitalia , female distribution of hair. No lesions. Urethral meatus normal in size, no  Prolapse, no lesions visibly  Present. Bladder non tender. Vagina pink and moist , with no visible lesions ,white  discharge present . Adequate pelvic support no  cystocele or rectocele noted Cervix pink and appears healthy, no lesions or ulcerations noted, no discharge noted from os Uterus normal size, no adnexal masses, no cervical motion or adnexal tenderness.   Musculoskeletal exam: Full ROM of spine, hips , shoulders and knees. No deformity ,swelling or crepitus  noted. No muscle wasting or atrophy.   Neurologic: Cranial nerves 2 to 12 intact. Power, tone ,sensation and reflexes normal throughout. No disturbance in gait. No tremor.  Skin: Intact, no ulceration, erythema , scaling or rash noted. Pigmentation normal throughout  Psych; Normal mood and affect. Judgement and concentration normal        Assessment & Plan:  Physical exam Annual exam as documented. Counseling done  re healthy lifestyle involving commitment to 150 minutes exercise per week, heart healthy diet, and attaining healthy weight.The importance of adequate sleep also discussed. Regular seat belt use and home safety, is also discussed. Changes in health habits are decided on by the patient with goals and time frames  set for achieving them. Immunization and cancer screening needs are specifically addressed at this visit.    Need for prophylactic vaccination and inoculation against influenza Vaccine administered at visit.

## 2014-10-14 NOTE — Patient Instructions (Addendum)
CPE in 1 year, call [id number] you eed me before  Contraceptives refilled for 1 year  Flu vaccine today  Labs today CBC, lipid, chem 7 , TSH.nd HIV, RPR  Specimens sent , you will be contacted with the results.  Rememebr to practice SAFE HABITS ALL THE TIME, as we discussed.

## 2014-10-14 NOTE — Assessment & Plan Note (Signed)
Vaccine administered at visit.  

## 2014-10-15 LAB — BASIC METABOLIC PANEL
BUN: 14 mg/dL (ref 6–23)
CHLORIDE: 102 meq/L (ref 96–112)
CO2: 28 meq/L (ref 19–32)
Calcium: 9.4 mg/dL (ref 8.4–10.5)
Creat: 0.93 mg/dL (ref 0.50–1.10)
GLUCOSE: 76 mg/dL (ref 70–99)
POTASSIUM: 4.2 meq/L (ref 3.5–5.3)
SODIUM: 138 meq/L (ref 135–145)

## 2014-10-15 LAB — LIPID PANEL
CHOL/HDL RATIO: 1.8 ratio
CHOLESTEROL: 162 mg/dL (ref 0–200)
HDL: 88 mg/dL (ref >=46)
LDL CALC: 64 mg/dL (ref 0–99)
Triglycerides: 49 mg/dL (ref <150)
VLDL: 10 mg/dL (ref 0–40)

## 2014-10-15 LAB — CBC
HCT: 41.3 % (ref 36.0–46.0)
HEMOGLOBIN: 13.7 g/dL (ref 12.0–15.0)
MCH: 29.5 pg (ref 26.0–34.0)
MCHC: 33.2 g/dL (ref 30.0–36.0)
MCV: 88.8 fL (ref 78.0–100.0)
MPV: 11.6 fL (ref 8.6–12.4)
PLATELETS: 171 10*3/uL (ref 150–400)
RBC: 4.65 MIL/uL (ref 3.87–5.11)
RDW: 13.4 % (ref 11.5–15.5)
WBC: 4.2 10*3/uL (ref 4.0–10.5)

## 2014-10-15 LAB — RPR

## 2014-10-15 LAB — HIV ANTIBODY (ROUTINE TESTING W REFLEX): HIV: NONREACTIVE

## 2014-10-16 LAB — CERVICOVAGINAL ANCILLARY ONLY
WET PREP (BD AFFIRM): NEGATIVE
Wet Prep (BD Affirm): NEGATIVE
Wet Prep (BD Affirm): POSITIVE — AB

## 2014-10-16 LAB — CYTOLOGY - PAP

## 2014-10-17 ENCOUNTER — Other Ambulatory Visit: Payer: Self-pay

## 2014-10-17 MED ORDER — METRONIDAZOLE 500 MG PO TABS: 500.0000 mg | ORAL_TABLET | Freq: Two times a day (BID) | ORAL | Status: DC

## 2014-10-17 MED ORDER — FLUCONAZOLE 150 MG PO TABS: 150.0000 mg | ORAL_TABLET | Freq: Once | ORAL | Status: DC

## 2015-07-07 ENCOUNTER — Ambulatory Visit (INDEPENDENT_AMBULATORY_CARE_PROVIDER_SITE_OTHER): Payer: BLUE CROSS/BLUE SHIELD | Admitting: Family Medicine

## 2015-07-07 ENCOUNTER — Encounter: Payer: Self-pay | Admitting: Family Medicine

## 2015-07-07 ENCOUNTER — Other Ambulatory Visit (HOSPITAL_COMMUNITY)
Admission: RE | Admit: 2015-07-07 | Discharge: 2015-07-07 | Disposition: A | Discharge status: Home / Self Care | Payer: BLUE CROSS/BLUE SHIELD | Source: Ambulatory Visit | Attending: Family Medicine | Admitting: Family Medicine

## 2015-07-07 VITALS — BP 100/60 | HR 68 | Resp 18 | Ht 65.0 in | Wt 113.0 lb

## 2015-07-07 DIAGNOSIS — N3000 Acute cystitis without hematuria: Secondary | ICD-10-CM | POA: Diagnosis not present

## 2015-07-07 DIAGNOSIS — Z113 Encounter for screening for infections with a predominantly sexual mode of transmission: Secondary | ICD-10-CM | POA: Insufficient documentation

## 2015-07-07 DIAGNOSIS — Z23 Encounter for immunization: Secondary | ICD-10-CM

## 2015-07-07 DIAGNOSIS — N946 Dysmenorrhea, unspecified: Secondary | ICD-10-CM

## 2015-07-07 DIAGNOSIS — N760 Acute vaginitis: Secondary | ICD-10-CM

## 2015-07-07 DIAGNOSIS — N939 Abnormal uterine and vaginal bleeding, unspecified: Secondary | ICD-10-CM

## 2015-07-07 LAB — POCT URINALYSIS DIPSTICK
Bilirubin, UA: NEGATIVE
Blood, UA: NEGATIVE
GLUCOSE UA: NEGATIVE
KETONES UA: NEGATIVE
LEUKOCYTES UA: NEGATIVE
Nitrite, UA: NEGATIVE
SPEC GRAV UA: 1.02
Urobilinogen, UA: 1
pH, UA: 8.5

## 2015-07-07 LAB — POCT URINE PREGNANCY: Preg Test, Ur: NEGATIVE

## 2015-07-07 MED ORDER — NORETHIN-ETH ESTRAD TRIPHASIC 0.5/0.75/1-35 MG-MCG PO TABS: 1.0000 | ORAL_TABLET | Freq: Every day | ORAL | Status: DC

## 2015-07-07 MED ORDER — IBUPROFEN 800 MG PO TABS: ORAL_TABLET | ORAL | Status: DC

## 2015-07-07 NOTE — Progress Notes (Signed)
   Subjective:    Patient ID: Mariah Fisher, female    DOB: March 03, 1991, 24 y.o.   MRN: [id number]  HPI 3 month h/o excess clotting ,  For first 2 days of cycle, moreso on the first, excess pain  2  to 3 days before onset  And on the first  day, pain is a 10, also has nausea , no vomit , had headache last cycle . C/o urinary frequency, concerned that she may be pregnant, denies dysuria Notes increased and malodorous vaginal d/c uses condoms all the time   Review of Systems See HPI Denies recent fever or chills. Denies sinus pressure, nasal congestion, ear pain or sore throat. Denies chest congestion, productive cough or wheezing. Denies chest pains, palpitations and leg swelling Denies abdominal pain, nausea, vomiting,diarrhea or constipation.   . Denies joint pain, swelling and limitation in mobility. Denies  seizures, numbness, or tingling. Denies depression, anxiety or insomnia. Denies skin break down or rash.        Objective:   Physical Exam  BP 100/60 mmHg  Pulse 68  Resp 18  Ht 5\' 5"  (1.651 m)  Wt 113 lb (51.256 kg)  BMI 18.80 kg/m2  SpO2 99%  LMP 06/22/2015 (Within Days) Patient alert and oriented and in no cardiopulmonary distress.  HEENT: No facial asymmetry, EOMI,   oropharynx pink and moist.  Neck supple no JVD, no mass.  Chest: Clear to auscultation bilaterally.  CVS: S1, S2 no murmurs, no S3.Regular rate.  ABD: Soft non tender.  Pelvic: uterus not  Enlarged, cervix firm, malodorous vaginal d/c , no cervical motion or adnexal tenderness Ext: No edema  MS: Adequate ROM spine, shoulders, hips and knees.  Skin: Intact, no ulcerations or rash noted.  Psych: Good eye contact, normal affect. Memory intact not anxious or depressed appearing.  CNS: CN 2-12 intact, power,  normal throughout.no focal deficits noted.       Assessment & Plan:  Vaginitis and vulvovaginitis Specimens sent for testing, will hold on treatment pending results  Abnormal  bleeding in menstrual cycle 3 month h/o excess clotting, and flooding, change to ortho 7/7/7, rule out infectious cause. No pelvic US at this time Urine pregnancy test in office is negative  Dysmenorrhea Ibuprofen 800 mg twice daily for 4 days per month , start 3 days before onset of menses  Need for prophylactic vaccination and inoculation against influenza After obtaining informed consent, the vaccine is  administered by LPN.   Acute cystitis without hematuria CCUA in office negative for infection or any abnormality

## 2015-07-07 NOTE — Patient Instructions (Signed)
F/u in 4 month, call if you need me sooner    You are being changed to a different contraceptive pill ortho 7/7/7  You need to take ibuprofen 800 mg tablet one twice daily for 3 days before onset of cycle to help with pain and clotting and also on first day of cycle  Call after 2 months of this new regime if no improvement, I will refer you to gyne at that time  Specimens are sent for testing.  I am holding on US of pelvis based on today's exam

## 2015-07-08 DIAGNOSIS — N3000 Acute cystitis without hematuria: Secondary | ICD-10-CM | POA: Insufficient documentation

## 2015-07-08 DIAGNOSIS — N946 Dysmenorrhea, unspecified: Secondary | ICD-10-CM | POA: Insufficient documentation

## 2015-07-08 DIAGNOSIS — N939 Abnormal uterine and vaginal bleeding, unspecified: Secondary | ICD-10-CM | POA: Insufficient documentation

## 2015-07-08 NOTE — Assessment & Plan Note (Signed)
Ibuprofen 800 mg twice daily for 4 days per month , start 3 days before onset of menses

## 2015-07-08 NOTE — Assessment & Plan Note (Signed)
CCUA in office negative for infection or any abnormality

## 2015-07-08 NOTE — Assessment & Plan Note (Addendum)
3 month h/o excess clotting, and flooding, change to ortho 7/7/7, rule out infectious cause. No pelvic US at this time Urine pregnancy test in office is negative

## 2015-07-08 NOTE — Assessment & Plan Note (Signed)
Specimens sent for testing, will hold on treatment pending results

## 2015-07-08 NOTE — Assessment & Plan Note (Signed)
After obtaining informed consent, the vaccine is  administered by LPN.  

## 2015-07-09 LAB — CERVICOVAGINAL ANCILLARY ONLY
Chlamydia: NEGATIVE
Neisseria Gonorrhea: NEGATIVE
WET PREP (BD AFFIRM): POSITIVE — AB

## 2015-07-11 MED ORDER — METRONIDAZOLE 500 MG PO TABS: 500.0000 mg | ORAL_TABLET | Freq: Two times a day (BID) | ORAL | Status: DC

## 2015-07-11 MED ORDER — FLUCONAZOLE 150 MG PO TABS: 150.0000 mg | ORAL_TABLET | Freq: Once | ORAL | Status: DC

## 2015-07-11 NOTE — Addendum Note (Signed)
Addended by: Kandis FantasiaSLADE, COURTNEY B on: 07/11/2015 10:51 AM   Modules accepted: Orders

## 2015-10-15 ENCOUNTER — Encounter: Payer: Self-pay | Admitting: Family Medicine

## 2015-11-04 ENCOUNTER — Encounter: Payer: Self-pay | Admitting: Family Medicine

## 2015-11-04 ENCOUNTER — Ambulatory Visit (INDEPENDENT_AMBULATORY_CARE_PROVIDER_SITE_OTHER): Payer: BLUE CROSS/BLUE SHIELD | Admitting: Family Medicine

## 2015-11-04 VITALS — BP 108/70 | HR 78 | Resp 16 | Ht 65.0 in | Wt 116.1 lb

## 2015-11-04 DIAGNOSIS — J3089 Other allergic rhinitis: Secondary | ICD-10-CM | POA: Diagnosis not present

## 2015-11-04 DIAGNOSIS — J309 Allergic rhinitis, unspecified: Secondary | ICD-10-CM | POA: Insufficient documentation

## 2015-11-04 DIAGNOSIS — N946 Dysmenorrhea, unspecified: Secondary | ICD-10-CM

## 2015-11-04 DIAGNOSIS — N939 Abnormal uterine and vaginal bleeding, unspecified: Secondary | ICD-10-CM | POA: Diagnosis not present

## 2015-11-04 DIAGNOSIS — E559 Vitamin D deficiency, unspecified: Secondary | ICD-10-CM

## 2015-11-04 LAB — BASIC METABOLIC PANEL
BUN: 9 mg/dL (ref 7–25)
CALCIUM: 9 mg/dL (ref 8.6–10.2)
CO2: 25 mmol/L (ref 20–31)
Chloride: 102 mmol/L (ref 98–110)
Creat: 0.93 mg/dL (ref 0.50–1.10)
Glucose, Bld: 78 mg/dL (ref 65–99)
POTASSIUM: 4.4 mmol/L (ref 3.5–5.3)
SODIUM: 136 mmol/L (ref 135–146)

## 2015-11-04 LAB — TSH: TSH: 2.19 m[IU]/L

## 2015-11-04 LAB — CBC
HCT: 38.5 % (ref 36.0–46.0)
Hemoglobin: 13.1 g/dL (ref 12.0–15.0)
MCH: 29.6 pg (ref 26.0–34.0)
MCHC: 34 g/dL (ref 30.0–36.0)
MCV: 86.9 fL (ref 78.0–100.0)
MPV: 11.6 fL (ref 8.6–12.4)
Platelets: 183 K/uL (ref 150–400)
RBC: 4.43 MIL/uL (ref 3.87–5.11)
RDW: 12.9 % (ref 11.5–15.5)
WBC: 4.1 K/uL (ref 4.0–10.5)

## 2015-11-04 MED ORDER — FLUTICASONE PROPIONATE 50 MCG/ACT NA SUSP: 2.0000 | Freq: Every day | NASAL | Status: AC

## 2015-11-04 MED ORDER — NORGESTIM-ETH ESTRAD TRIPHASIC 0.18/0.215/0.25 MG-35 MCG PO TABS
ORAL_TABLET | ORAL | Status: DC
Start: 2015-11-04 — End: 2015-12-11

## 2015-11-04 MED ORDER — IBUPROFEN 800 MG PO TABS: ORAL_TABLET | ORAL | Status: AC

## 2015-11-04 NOTE — Progress Notes (Signed)
   Subjective:    Patient ID: Mariah Fisher, female    DOB: 1991-05-05, 25 y.o.   MRN: [id number]  HPI   Mariah Fisher     MRN: [id number]      DOB: 1991-05-05   HPI Mariah Fisher is here for follow up and re-evaluation of chronic medical conditions, medication management and review of any available recent lab and radiology data.  Preventive health is updated, specifically  Cancer screening and Immunization.    The PT denies any adverse reactions to current medications since the last visit.excellent response to OCP, no adverse s/e , menses now light, regular and essentially painless  There are no new concerns.  There are no specific complaints   ROS Denies recent fever or chills. Denies sinus pressure, nasal congestion, ear pain or sore throat. Denies chest congestion, productive cough or wheezing. Denies chest pains, palpitations and leg swelling Denies abdominal pain, nausea, vomiting,diarrhea or constipation.   Denies dysuria, frequency, hesitancy or incontinence. Denies joint pain, swelling and limitation in mobility. Denies headaches, seizures, numbness, or tingling. Denies depression, anxiety or insomnia. Denies skin break down or rash.   PE  BP 108/70 mmHg  Pulse 78  Resp 16  Ht 5\' 5"  (1.651 m)  Wt 116 lb 1.9 oz (52.672 kg)  BMI 19.32 kg/m2  SpO2 98%  Patient alert and oriented and in no cardiopulmonary distress.  HEENT: No facial asymmetry, EOMI,   oropharynx pink and moist.  Neck supple no JVD, no mass.  Chest: Clear to auscultation bilaterally.  CVS: S1, S2 no murmurs, no S3.Regular rate.  ABD: Soft non tender.   Ext: No edema  MS: Adequate ROM spine, shoulders, hips and knees.  Skin: Intact, no ulcerations or rash noted.  Psych: Good eye contact, normal affect. Memory intact not anxious or depressed appearing.  CNS: CN 2-12 intact, power,  normal throughout.no focal deficits noted.   Assessment & Plan   Dysmenorrhea Marked improvement with  contraceptive pill, no need for FMLA, will use ibuprofen if needed also, however , no longer feels he need to use regularly, which is great  Abnormal bleeding in menstrual cycle Corrected with OCP  Allergic rhinitis Increased symptoms with season change , start daily flonase       Review of Systems     Objective:   Physical Exam        Assessment & Plan:

## 2015-11-04 NOTE — Patient Instructions (Signed)
Annual physical exam early October, call if you need me before  Labs today   Ibuprofen and birth control refilled  Thankful you are much better, girl power, no FMLA needed!  [facility], we consider it a privelige to serve you.

## 2015-11-05 LAB — VITAMIN D 25 HYDROXY (VIT D DEFICIENCY, FRACTURES): Vit D, 25-Hydroxy: 39 ng/mL (ref 30–100)

## 2015-11-08 NOTE — Assessment & Plan Note (Signed)
Increased symptoms with season change, start daily flonase 

## 2015-11-08 NOTE — Assessment & Plan Note (Signed)
Marked improvement with contraceptive pill, no need for FMLA, will use ibuprofen if needed also, however , no longer feels he need to use regularly, which is great

## 2015-11-08 NOTE — Assessment & Plan Note (Signed)
Corrected with OCP 

## 2015-12-11 ENCOUNTER — Telehealth: Payer: Self-pay | Admitting: Family Medicine

## 2015-12-11 MED ORDER — NORETHIN-ETH ESTRAD TRIPHASIC 0.5/0.75/1-35 MG-MCG PO TABS: 1.0000 | ORAL_TABLET | Freq: Every day | ORAL | Status: DC

## 2015-12-11 NOTE — Telephone Encounter (Signed)
Patient is calling stating that the wrong birth control medication was called in she needs the Des[place], please advise

## 2015-12-11 NOTE — Telephone Encounter (Signed)
The most recent bc pill was sent back in

## 2016-05-25 ENCOUNTER — Encounter: Payer: BLUE CROSS/BLUE SHIELD | Admitting: Family Medicine

## 2016-06-29 ENCOUNTER — Encounter: Payer: BLUE CROSS/BLUE SHIELD | Admitting: Family Medicine

## 2016-12-17 ENCOUNTER — Other Ambulatory Visit: Payer: Self-pay | Admitting: Family Medicine

## 2017-03-14 ENCOUNTER — Emergency Department (HOSPITAL_COMMUNITY)
Admission: EM | Admit: 2017-03-14 | Discharge: 2017-03-14 | Disposition: A | Discharge status: Home / Self Care | Payer: BLUE CROSS/BLUE SHIELD | Attending: Emergency Medicine | Admitting: Emergency Medicine

## 2017-03-14 ENCOUNTER — Encounter (HOSPITAL_COMMUNITY): Payer: Self-pay | Admitting: Family Medicine

## 2017-03-14 DIAGNOSIS — R569 Unspecified convulsions: Secondary | ICD-10-CM

## 2017-03-14 DIAGNOSIS — G40909 Epilepsy, unspecified, not intractable, without status epilepticus: Secondary | ICD-10-CM | POA: Insufficient documentation

## 2017-03-14 HISTORY — DX: Unspecified convulsions: R56.9

## 2017-03-14 LAB — POC URINE PREG, ED: PREG TEST UR: NEGATIVE

## 2017-03-14 NOTE — ED Provider Notes (Signed)
[facility]-EMERGENCY DEPT Provider Note   CSN: [id number] Arrival date & time: 03/14/17  1519     History   Chief Complaint Chief Complaint  Patient presents with  . Seizures    HPI Mariah Fisher is a 26 y.o. female.  HPI 26 year old female with a history of seizure not currently on any antiepileptics who presents to the ED after a seizure. She does not remember the episode and there is no friends or family at bedside able to provide for a full history. However patient reported that she was told she had a seizure for approximately 1-1/2 minutes. Patient does remember being "out of it" following the seizure. States that she has had seizures since she was in the seventh grade and has never been on any medication. Last seizure was 1-1/2 years ago. Denies recent fevers, illnesses, infections, trauma. Currently on her menstrual cycle.  Reports that she has had 2 weeks of right-sided headache that has been constant since onset, mild to moderate in intensity. He reports that it is mildly improved with over-the-counter medicine and exacerbated with light. Denies any other alleviating or aggravating factors. Denies any history of migraine headaches. Reports that this headache started 1 week after being placed on new OCPs by her PCP. Denies any visual deficits, or focal weaknesses.  Denies any other physical complaints.   Past Medical History:  Diagnosis Date  . Panic disorder    started at age 1  . Seizures [facility])     Patient Active Problem List   Diagnosis Date Noted  . Allergic rhinitis 11/04/2015  . Abnormal bleeding in menstrual cycle 07/08/2015  . Dysmenorrhea 07/08/2015  . Vitamin D deficiency 04/19/2011    History reviewed. No pertinent surgical history.  OB History    No data available       Home Medications    Prior to Admission medications   Medication Sig Start Date End Date Taking? Authorizing Provider  amoxicillin (AMOXIL) 500 MG capsule Take 500 mg by mouth 4  (four) times daily. 03/08/17  Yes [provider]  DASETTA 1/35 tablet take 1 tablet by mouth once daily 12/17/16  Yes Kerri Perches, MD  ibuprofen (ADVIL,MOTRIN) 800 MG tablet One tablet twice daily for 4 days each month Start taking 3 days before onset of cycle 11/04/15  Yes Kerri Perches, MD  fluticasone [facility]) 50 MCG/ACT nasal spray Place 2 sprays into both nostrils daily. Patient not taking: Reported on 03/14/2017 11/04/15   Kerri Perches, MD    Family History Family History  Problem Relation Age of Onset  . Hypertension Mother   . Asthma Sister   . Asthma Brother     Social History Social History  Substance Use Topics  . Smoking status: Never Smoker  . Smokeless tobacco: Never Used  . Alcohol use Yes     Comment: Once every 2 months. Last drink: Saturday.      Allergies   Cat hair extract   Review of Systems Review of Systems   Physical Exam Updated Vital Signs BP 109/67 (BP Location: Left Arm)   Pulse 72   Temp 98.6 F (37 C) (Oral)   Resp 16   Ht 5\' 5"  (1.651 m)   Wt 51.3 kg (113 lb)   LMP 03/09/2017   SpO2 100% Comment: RA  BMI 18.80 kg/m   Physical Exam  Constitutional: She is oriented to person, place, and time. She appears well-developed and well-nourished. No distress.  HENT:  Head: Normocephalic and  atraumatic.  Nose: Nose normal.  Eyes: Pupils are equal, round, and reactive to light. Conjunctivae and EOM are normal. Right eye exhibits no discharge. Left eye exhibits no discharge. No scleral icterus.  Fundoscopic exam:      The right eye shows no papilledema. The right eye shows venous pulsations.       The left eye shows no papilledema. The left eye shows venous pulsations.  Neck: Normal range of motion. Neck supple.  Cardiovascular: Normal rate and regular rhythm.  Exam reveals no gallop and no friction rub.   No murmur heard. Pulmonary/Chest: Effort normal and breath sounds normal. No stridor. No respiratory  distress. She has no rales.  Abdominal: Soft. She exhibits no distension. There is no tenderness.  Musculoskeletal: She exhibits no edema or tenderness.  Neurological: She is alert and oriented to person, place, and time.  Mental Status: Alert and oriented to person, place, and time. Attention and concentration normal. Speech clear. Recent memory is intact  Cranial Nerves  II Visual Fields: Intact to confrontation. Visual fields intact. III, IV, VI: Pupils equal and reactive to light and near. Full eye movement without nystagmus  V Facial Sensation: Normal. No weakness of masticatory muscles  VII: No facial weakness or asymmetry  VIII Auditory Acuity: Grossly normal  IX/X: The uvula is midline; the palate elevates symmetrically  XI: Normal sternocleidomastoid and trapezius strength  XII: The tongue is midline. No atrophy or fasciculations.   Motor System: Muscle Strength: 5/5 and symmetric in the upper and lower extremities. No pronation or drift.  Muscle Tone: Tone and muscle bulk are normal in the upper and lower extremities.   Reflexes: DTRs: 1+ and symmetrical in all four extremities. Plantar responses are flexor bilaterally.  Coordination: Intact finger-to-nose, heel-to-shin. No tremor.  Sensation: Intact to light touch, and pinprick. Gait: Routine gait normal.    Skin: Skin is warm and dry. No rash noted. She is not diaphoretic. No erythema.  Psychiatric: She has a normal mood and affect.  Vitals reviewed.    ED Treatments / Results  Labs (all labs ordered are listed, but only abnormal results are displayed) Labs Reviewed  POC URINE PREG, ED    EKG  EKG Interpretation None       Radiology No results found.  Procedures Procedures (including critical care time)  Medications Ordered in ED Medications - No data to display   Initial Impression / Assessment and Plan / ED Course  I have reviewed the triage vital signs and the nursing notes.  Pertinent labs &  imaging results that were available during my care of the patient were reviewed by me and considered in my medical decision making (see chart for details).     1. Seizure Reported history of prior seizures not currently on antiepileptics. UPT negative. No recent infections, head trauma.  Back to normal and at patient's baseline.  2. Headache Non focal neuro exam. No recent head trauma. No papilledema on exam. No fever. Doubt meningitis. Doubt intracranial bleed. Doubt IIH. Doubt venous sinus thrombosis. No indication for imaging. Likely side effect from OCPs. Recommended contacting her primary care provider to see why she was changed to this OCP and to see if she could be placed back on her prior OCPs.  The patient is safe for discharge with strict return precautions.    Final Clinical Impressions(s) / ED Diagnoses   Final diagnoses:  Seizure-like activity (HCC)   Disposition: Discharge  Condition: Good  I have discussed the results,  Dx and Tx plan with the patient who expressed understanding and agree(s) with the plan. Discharge instructions discussed at great length. The patient was given strict return precautions who verbalized understanding of the instructions. No further questions at time of discharge.    New Prescriptions   No medications on file    Follow Up: Kerri Perches, MD [address] [place] [address] [phone]     [facility] NEUROLOGY [address] [place], [address] [place] [address] [phone] Schedule an appointment as soon as possible for a visit        Nira Conn, MD 03/14/17 1840

## 2017-03-14 NOTE — ED Notes (Signed)
Bed: [facility] Expected date:  Expected time:  Means of arrival:  Comments: EMS seizure 

## 2018-02-11 ENCOUNTER — Other Ambulatory Visit: Payer: Self-pay

## 2018-02-11 ENCOUNTER — Emergency Department (HOSPITAL_COMMUNITY): Payer: BLUE CROSS/BLUE SHIELD

## 2018-02-11 ENCOUNTER — Emergency Department (HOSPITAL_COMMUNITY)
Admission: EM | Admit: 2018-02-11 | Discharge: 2018-02-11 | Disposition: A | Discharge status: Home / Self Care | Payer: BLUE CROSS/BLUE SHIELD | Attending: Emergency Medicine | Admitting: Emergency Medicine

## 2018-02-11 ENCOUNTER — Encounter (HOSPITAL_COMMUNITY): Payer: Self-pay | Admitting: Emergency Medicine

## 2018-02-11 DIAGNOSIS — W2210XA Striking against or struck by unspecified automobile airbag, initial encounter: Secondary | ICD-10-CM | POA: Insufficient documentation

## 2018-02-11 DIAGNOSIS — M79672 Pain in left foot: Secondary | ICD-10-CM | POA: Insufficient documentation

## 2018-02-11 DIAGNOSIS — Z23 Encounter for immunization: Secondary | ICD-10-CM | POA: Diagnosis not present

## 2018-02-11 DIAGNOSIS — Y92410 Unspecified street and highway as the place of occurrence of the external cause: Secondary | ICD-10-CM | POA: Insufficient documentation

## 2018-02-11 DIAGNOSIS — F1210 Cannabis abuse, uncomplicated: Secondary | ICD-10-CM | POA: Insufficient documentation

## 2018-02-11 DIAGNOSIS — Y9389 Activity, other specified: Secondary | ICD-10-CM | POA: Diagnosis not present

## 2018-02-11 DIAGNOSIS — M79671 Pain in right foot: Secondary | ICD-10-CM | POA: Diagnosis present

## 2018-02-11 DIAGNOSIS — Y998 Other external cause status: Secondary | ICD-10-CM | POA: Diagnosis not present

## 2018-02-11 MED ORDER — IBUPROFEN 400 MG PO TABS: 600.0000 mg | ORAL_TABLET | Freq: Once | ORAL | Status: DC

## 2018-02-11 MED ORDER — TETANUS-DIPHTH-ACELL PERTUSSIS 5-2.5-18.5 LF-MCG/0.5 IM SUSP
0.5000 mL | Freq: Once | INTRAMUSCULAR | Status: AC
  Administered 2018-02-11: 0.5 mL via INTRAMUSCULAR
  Filled 2018-02-11: qty 0.5

## 2018-02-11 MED ORDER — OXYCODONE-ACETAMINOPHEN 5-325 MG PO TABS
1.0000 | ORAL_TABLET | Freq: Once | ORAL | Status: AC
  Administered 2018-02-11: 1 via ORAL
  Filled 2018-02-11: qty 1

## 2018-02-11 NOTE — ED Notes (Signed)
Pt. Right foot cleansed and wrapped.
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
## 2018-02-11 NOTE — Discharge Instructions (Addendum)
The x-ray of your both of your feet and shoulder were reassuring.  No broken bones.  You can take 600 mg ibuprofen every 6 hours for pain.  Also apply ice to the tops of your feet and shoulder to help with pain.  Clean your abrasions with warm soapy water daily starting tomorrow.  Look out for signs of infection like redness, swelling and fever.  Return to the ER if you have any of the symptoms.
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
## 2018-02-15 ENCOUNTER — Telehealth: Payer: Self-pay | Admitting: Family Medicine

## 2018-02-15 NOTE — Telephone Encounter (Signed)
I spoke with the pt and advised her to be evaluated at an urgent care so that she can be cleared to return to work as there is no availability for 2 weeks, she agreed

## 2018-02-15 NOTE — Telephone Encounter (Signed)
See note, this isclosed
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
## 2018-03-10 ENCOUNTER — Telehealth: Payer: Self-pay | Admitting: Family Medicine
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
## 2018-03-14 ENCOUNTER — Ambulatory Visit: Payer: BLUE CROSS/BLUE SHIELD | Admitting: Family Medicine

## 2018-03-24 ENCOUNTER — Emergency Department (HOSPITAL_COMMUNITY): Payer: BLUE CROSS/BLUE SHIELD

## 2018-03-24 ENCOUNTER — Emergency Department (HOSPITAL_COMMUNITY)
Admission: EM | Admit: 2018-03-24 | Discharge: 2018-03-25 | Disposition: A | Discharge status: Home / Self Care | Payer: BLUE CROSS/BLUE SHIELD | Attending: Emergency Medicine | Admitting: Emergency Medicine

## 2018-03-24 ENCOUNTER — Other Ambulatory Visit: Payer: Self-pay

## 2018-03-24 ENCOUNTER — Encounter (HOSPITAL_COMMUNITY): Payer: Self-pay

## 2018-03-24 DIAGNOSIS — Z79899 Other long term (current) drug therapy: Secondary | ICD-10-CM | POA: Insufficient documentation

## 2018-03-24 DIAGNOSIS — G44309 Post-traumatic headache, unspecified, not intractable: Secondary | ICD-10-CM | POA: Diagnosis not present

## 2018-03-24 DIAGNOSIS — H538 Other visual disturbances: Secondary | ICD-10-CM | POA: Diagnosis not present

## 2018-03-24 DIAGNOSIS — R110 Nausea: Secondary | ICD-10-CM | POA: Insufficient documentation

## 2018-03-24 DIAGNOSIS — R51 Headache: Secondary | ICD-10-CM | POA: Diagnosis present

## 2018-03-24 DIAGNOSIS — F1210 Cannabis abuse, uncomplicated: Secondary | ICD-10-CM | POA: Diagnosis not present

## 2018-03-24 DIAGNOSIS — R42 Dizziness and giddiness: Secondary | ICD-10-CM | POA: Insufficient documentation

## 2018-03-24 DIAGNOSIS — R195 Other fecal abnormalities: Secondary | ICD-10-CM | POA: Diagnosis not present

## 2018-03-24 DIAGNOSIS — R109 Unspecified abdominal pain: Secondary | ICD-10-CM | POA: Diagnosis not present

## 2018-03-24 DIAGNOSIS — M549 Dorsalgia, unspecified: Secondary | ICD-10-CM | POA: Insufficient documentation

## 2018-03-24 LAB — CBC WITH DIFFERENTIAL/PLATELET
ABS IMMATURE GRANULOCYTES: 0 10*3/uL (ref 0.0–0.1)
BASOS ABS: 0 10*3/uL (ref 0.0–0.1)
BASOS PCT: 1 %
EOS ABS: 0.1 10*3/uL (ref 0.0–0.7)
Eosinophils Relative: 1 %
HCT: 40.4 % (ref 36.0–46.0)
Hemoglobin: 13.7 g/dL (ref 12.0–15.0)
IMMATURE GRANULOCYTES: 0 %
Lymphocytes Relative: 38 %
Lymphs Abs: 1.9 10*3/uL (ref 0.7–4.0)
MCH: 30.3 pg (ref 26.0–34.0)
MCHC: 33.9 g/dL (ref 30.0–36.0)
MCV: 89.4 fL (ref 78.0–100.0)
MONOS PCT: 14 %
Monocytes Absolute: 0.7 10*3/uL (ref 0.1–1.0)
NEUTROS PCT: 46 %
Neutro Abs: 2.3 10*3/uL (ref 1.7–7.7)
PLATELETS: 185 10*3/uL (ref 150–400)
RBC: 4.52 MIL/uL (ref 3.87–5.11)
RDW: 12.6 % (ref 11.5–15.5)
WBC: 5.1 10*3/uL (ref 4.0–10.5)

## 2018-03-24 LAB — BASIC METABOLIC PANEL
Anion gap: 11 (ref 5–15)
BUN: 14 mg/dL (ref 6–20)
CALCIUM: 9.4 mg/dL (ref 8.9–10.3)
CO2: 27 mmol/L (ref 22–32)
CREATININE: 0.9 mg/dL (ref 0.44–1.00)
Chloride: 102 mmol/L (ref 98–111)
GFR calc Af Amer: >60 mL/min (ref >60)
GFR calc non Af Amer: >60 mL/min (ref >60)
GLUCOSE: 85 mg/dL (ref 70–99)
Potassium: 3.6 mmol/L (ref 3.5–5.1)
Sodium: 140 mmol/L (ref 135–145)
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
## 2018-03-25 ENCOUNTER — Emergency Department (HOSPITAL_COMMUNITY): Payer: BLUE CROSS/BLUE SHIELD

## 2018-03-25 LAB — URINALYSIS, ROUTINE W REFLEX MICROSCOPIC
Bilirubin Urine: NEGATIVE
Glucose, UA: NEGATIVE mg/dL
Hgb urine dipstick: NEGATIVE
Ketones, ur: 5 mg/dL — AB
LEUKOCYTES UA: NEGATIVE
NITRITE: NEGATIVE
PH: 5 (ref 5.0–8.0)
Protein, ur: NEGATIVE mg/dL
Specific Gravity, Urine: 1.029 (ref 1.005–1.030)

## 2018-03-25 LAB — PREGNANCY, URINE: Preg Test, Ur: NEGATIVE

## 2018-03-25 LAB — POC OCCULT BLOOD, ED: Fecal Occult Bld: NEGATIVE

## 2018-03-25 MED ORDER — KETOROLAC TROMETHAMINE 60 MG/2ML IM SOLN
60.0000 mg | Freq: Once | INTRAMUSCULAR | Status: AC
  Administered 2018-03-25: 60 mg via INTRAMUSCULAR
  Filled 2018-03-25: qty 2

## 2018-03-25 MED ORDER — HYDROCORTISONE 2.5 % RE CREA: TOPICAL_CREAM | RECTAL | 0 refills | Status: DC

## 2018-03-25 MED ORDER — MECLIZINE HCL 25 MG PO TABS: 25.0000 mg | ORAL_TABLET | Freq: Three times a day (TID) | ORAL | 0 refills | Status: DC | PRN

## 2018-03-25 NOTE — Discharge Instructions (Addendum)
1. Medications: Meclizine for dizziness, anusol for hemorrhoid, usual home medications 2. Treatment: rest, drink plenty of fluids, take stool softners 3. Follow Up: Please followup with neurology in 2 days for discussion of your diagnoses and further evaluation after today's visit; if you do not have a primary care doctor use the resource guide provided to find one; Please return to the ER for worsening symptoms, recurrent falls, worsening weakness, new or concerning symptoms
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
## 2019-03-31 ENCOUNTER — Other Ambulatory Visit: Payer: Self-pay

## 2019-03-31 ENCOUNTER — Emergency Department
Admission: EM | Admit: 2019-03-31 | Discharge: 2019-03-31 | Disposition: A | Discharge status: Home / Self Care | Payer: BC Managed Care – PPO | Attending: Emergency Medicine | Admitting: Emergency Medicine

## 2019-03-31 DIAGNOSIS — G40909 Epilepsy, unspecified, not intractable, without status epilepticus: Secondary | ICD-10-CM

## 2019-03-31 DIAGNOSIS — Z79899 Other long term (current) drug therapy: Secondary | ICD-10-CM | POA: Diagnosis not present

## 2019-03-31 DIAGNOSIS — R569 Unspecified convulsions: Secondary | ICD-10-CM | POA: Diagnosis present

## 2019-03-31 LAB — CBC
HCT: 38.9 % (ref 36.0–46.0)
Hemoglobin: 13.2 g/dL (ref 12.0–15.0)
MCH: 30.2 pg (ref 26.0–34.0)
MCHC: 33.9 g/dL (ref 30.0–36.0)
MCV: 89 fL (ref 80.0–100.0)
Platelets: 191 10*3/uL (ref 150–400)
RBC: 4.37 MIL/uL (ref 3.87–5.11)
RDW: 12.7 % (ref 11.5–15.5)
WBC: 3.9 10*3/uL — ABNORMAL LOW (ref 4.0–10.5)
nRBC: 0 % (ref 0.0–0.2)

## 2019-03-31 LAB — BASIC METABOLIC PANEL
Anion gap: 7 (ref 5–15)
BUN: 17 mg/dL (ref 6–20)
CO2: 26 mmol/L (ref 22–32)
Calcium: 8.6 mg/dL — ABNORMAL LOW (ref 8.9–10.3)
Chloride: 104 mmol/L (ref 98–111)
Creatinine, Ser: 0.87 mg/dL (ref 0.44–1.00)
GFR calc Af Amer: >60 mL/min (ref >60)
GFR calc non Af Amer: >60 mL/min (ref >60)
Glucose, Bld: 100 mg/dL — ABNORMAL HIGH (ref 70–99)
Potassium: 3.7 mmol/L (ref 3.5–5.1)
Sodium: 137 mmol/L (ref 135–145)

## 2019-03-31 NOTE — Discharge Instructions (Addendum)
You should make an appointment to follow-up with a primary care doctor.  You can find information through your insurance.  We have also provided referral to a local neurologist.  Return to the ER for new or recurrent seizures or any other new or worsening symptoms that concern you.

## 2019-03-31 NOTE — ED Notes (Signed)
EDP at bedside  

## 2019-03-31 NOTE — ED Triage Notes (Signed)
Pt arrives via EMS from work after having an unwitnessed seizure- pt states she has a head ache- pt states she has had syncopal episodes for about a year- CBG 102 per EMS- pt alert and oriented x4 at this time

## 2019-03-31 NOTE — ED Provider Notes (Signed)
[facility] Emergency Department Provider Note ____________________________________________   First MD Initiated Contact with Patient 03/31/19 1326     (approximate)  I have reviewed the triage vital signs and the nursing notes.   HISTORY  Chief Complaint Seizures    HPI Mariah Fisher is a 28 y.o. female with PMH as noted below including a known seizure disorder (not on any antiepileptics) who presents after an apparent seizure.  The patient states that she was on a call at work and began to feel strange, and then felt like she had difficulty speaking.  She then lost consciousness and was told that she had a seizure.  At this time, the patient states she feels a bit achy and tired but denies any other focal complaints.  She states that she has been feeling somewhat lightheaded and weak over the last week or 2.  She states that her last seizure was about a year ago and that she does not get them frequently.  Past Medical History:  Diagnosis Date  . Panic disorder    started at age 89  . Seizures [facility](HCC)     Patient Active Problem List   Diagnosis Date Noted  . Allergic rhinitis 11/04/2015  . Abnormal bleeding in menstrual cycle 07/08/2015  . Dysmenorrhea 07/08/2015  . Vitamin D deficiency 04/19/2011    History reviewed. No pertinent surgical history.  Prior to Admission medications   Medication Sig Start Date End Date Taking? Authorizing Provider  amoxicillin (AMOXIL) 500 MG capsule Take 500 mg by mouth 4 (four) times daily. 03/08/17   [provider]  DASETTA 1/35 tablet take 1 tablet by mouth once daily 12/17/16   Kerri PerchesSimpson, Margaret E, MD  fluticasone [facility](FLONASE) 50 MCG/ACT nasal spray Place 2 sprays into both nostrils daily. Patient not taking: Reported on 03/14/2017 11/04/15   Kerri PerchesSimpson, Margaret E, MD  hydrocortisone (ANUSOL-HC) 2.5 % rectal cream Apply rectally 2 times daily 03/25/18   Muthersbaugh, Dahlia ClientHannah, PA-C  ibuprofen (ADVIL,MOTRIN) 800 MG  tablet One tablet twice daily for 4 days each month Start taking 3 days before onset of cycle 11/04/15   Kerri PerchesSimpson, Margaret E, MD  meclizine (ANTIVERT) 25 MG tablet Take 1 tablet (25 mg total) by mouth 3 (three) times daily as needed for dizziness. 03/25/18   Muthersbaugh, Dahlia ClientHannah, PA-C    Allergies Cat hair extract  Family History  Problem Relation Age of Onset  . Hypertension Mother   . Asthma Sister   . Asthma Brother     Social History Social History   Tobacco Use  . Smoking status: Never Smoker  . Smokeless tobacco: Never Used  Substance Use Topics  . Alcohol use: Yes    Comment: Once every 2 months. Last drink: Saturday.   . Drug use: Yes    Types: Marijuana    Comment: Once every 2 weeks.     Review of Systems  Constitutional: No fever/chills. Eyes: No visual changes. ENT: No sore throat. Cardiovascular: Denies chest pain. Respiratory: Denies shortness of breath. Gastrointestinal: No vomiting or diarrhea.  Genitourinary: Negative for dysuria.  Musculoskeletal: Negative for back pain. Skin: Negative for rash. Neurological: Negative for headache.   ____________________________________________   PHYSICAL EXAM:  VITAL SIGNS: ED Triage Vitals  Enc Vitals Group     BP 03/31/19 1320 101/62     Pulse Rate 03/31/19 1322 71     Resp 03/31/19 1354 16     Temp 03/31/19 1354 98 F (36.7 C)  Temp Source 03/31/19 1354 Oral     SpO2 03/31/19 1322 100 %     Weight 03/31/19 1325 105 lb (47.6 kg)     Height 03/31/19 1325 5\' 6"  (1.676 m)     Head Circumference --      Peak Flow --      Pain Score 03/31/19 1324 0     Pain Loc --      Pain Edu? --      Excl. in [place]? --     Constitutional: Alert and oriented. Well appearing and in no acute distress. Eyes: Conjunctivae are normal.  EOMI.  PERRLA. Head: Atraumatic. Nose: No congestion/rhinnorhea. Mouth/Throat: Mucous membranes are moist.   Neck: Normal range of motion.  Cardiovascular: Normal rate, regular rhythm.  Good peripheral circulation. Respiratory: Normal respiratory effort.  No retractions.  Gastrointestinal:  No distention.  Musculoskeletal: Extremities warm and well perfused.  Neurologic:  Normal speech and language.  Motor and sensory intact in all extremities.  No pronator drift.  Normal coordination on finger-to-nose with no ataxia.   Skin:  Skin is warm and dry. No rash noted. Psychiatric: Mood and affect are normal. Speech and behavior are normal.  ____________________________________________   LABS (all labs ordered are listed, but only abnormal results are displayed)  Labs Reviewed  CBC - Abnormal; Notable for the following components:      Result Value   WBC 3.9 (*)    All other components within normal limits  BASIC METABOLIC PANEL - Abnormal; Notable for the following components:   Glucose, Bld 100 (*)    Calcium 8.6 (*)    All other components within normal limits  POC URINE PREG, ED   ____________________________________________  EKG  ED ECG REPORT I, Arta Silence, the attending physician, personally viewed and interpreted this ECG.  Date: 03/31/2019 EKG Time: 1355 Rate: 56 Rhythm: normal sinus rhythm QRS Axis: normal Intervals: normal ST/T Wave abnormalities: normal Narrative Interpretation: no evidence of acute ischemia  ____________________________________________  RADIOLOGY    ____________________________________________   PROCEDURES  Procedure(s) performed: No  Procedures  Critical Care performed: No ____________________________________________   INITIAL IMPRESSION / ASSESSMENT AND PLAN / ED COURSE  Pertinent labs & imaging results that were available during my care of the patient were reviewed by me and considered in my medical decision making (see chart for details).  28 year old female with a history of a seizure disorder not currently on antiepileptics presents after an apparent seizure.  The patient did have a prodrome where  she felt like she could not speak, and she also reports feeling somewhat lightheaded over at least the last several days if not longer.  On exam she is well-appearing.  Her vital signs are normal.  The neuro exam is nonfocal.  The remainder of the exam is unremarkable.  EKG shows no acute abnormalities.  Overall presentation is most consistent with seizure related to the patient's known seizure disorder, although syncope is also in the differential.  Because the patient has reported some generalized fatigue recently we will obtain basic labs.  If the patient has no further seizure activity and the lab work-up is unremarkable, I anticipate discharge home with neurology referral.  ----------------------------------------- 2:59 PM on 03/31/2019 -----------------------------------------  Lab work-up is unremarkable.  The patient continues to appear well and has had no further seizure activity.  She is stable for discharge home.  Return precautions given, and she expresses understanding. ____________________________________________   FINAL CLINICAL IMPRESSION(S) / ED DIAGNOSES  Final diagnoses:  Seizure disorder (HCC)      NEW MEDICATIONS STARTED DURING THIS VISIT:  New Prescriptions   No medications on file     Note:  This document was prepared using Dragon voice recognition software and may include unintentional dictation errors.    Dionne BucySiadecki, Jaja Switalski, MD 03/31/19 1500

## 2019-03-31 NOTE — ED Notes (Signed)
Pt sister in lobby wanting to come back to see pt. Registration called this RN to inform. Informed sister that only 1 visitor per policy. Sister stated that their mom is on her way and that mom could come back and sister wouldn't come back.  Sister wanted to get pt car keys. Pt looked in book bag and purse and thinks she left her car keys at work. Informed registration to inform sister.

## 2020-08-11 ENCOUNTER — Encounter (HOSPITAL_COMMUNITY): Payer: Self-pay

## 2020-08-11 ENCOUNTER — Other Ambulatory Visit: Payer: Self-pay

## 2020-08-11 ENCOUNTER — Ambulatory Visit (HOSPITAL_COMMUNITY)
Admission: EM | Admit: 2020-08-11 | Discharge: 2020-08-12 | Disposition: A | Discharge status: Home / Self Care | Payer: No Payment, Other | Attending: Psychiatry | Admitting: Psychiatry

## 2020-08-11 DIAGNOSIS — F4323 Adjustment disorder with mixed anxiety and depressed mood: Secondary | ICD-10-CM | POA: Diagnosis not present

## 2020-08-11 DIAGNOSIS — R45851 Suicidal ideations: Secondary | ICD-10-CM | POA: Insufficient documentation

## 2020-08-11 DIAGNOSIS — Z20822 Contact with and (suspected) exposure to covid-19: Secondary | ICD-10-CM | POA: Insufficient documentation

## 2020-08-11 DIAGNOSIS — Z79899 Other long term (current) drug therapy: Secondary | ICD-10-CM | POA: Insufficient documentation

## 2020-08-11 DIAGNOSIS — R454 Irritability and anger: Secondary | ICD-10-CM | POA: Insufficient documentation

## 2020-08-11 LAB — COMPREHENSIVE METABOLIC PANEL
ALT: 13 U/L (ref 0–44)
AST: 21 U/L (ref 15–41)
Albumin: 4.1 g/dL (ref 3.5–5.0)
Alkaline Phosphatase: 44 U/L (ref 38–126)
Anion gap: 11 (ref 5–15)
BUN: 13 mg/dL (ref 6–20)
CO2: 26 mmol/L (ref 22–32)
Calcium: 9.4 mg/dL (ref 8.9–10.3)
Chloride: 100 mmol/L (ref 98–111)
Creatinine, Ser: 0.96 mg/dL (ref 0.44–1.00)
GFR, Estimated: >60 mL/min (ref >60)
Glucose, Bld: 73 mg/dL (ref 70–99)
Potassium: 4.2 mmol/L (ref 3.5–5.1)
Sodium: 137 mmol/L (ref 135–145)
Total Bilirubin: 0.7 mg/dL (ref 0.3–1.2)
Total Protein: 7.8 g/dL (ref 6.5–8.1)

## 2020-08-11 LAB — POCT URINE DRUG SCREEN - MANUAL ENTRY (I-SCREEN)
POC Amphetamine UR: NOT DETECTED
POC Buprenorphine (BUP): NOT DETECTED
POC Cocaine UR: NOT DETECTED
POC Marijuana UR: POSITIVE — AB
POC Methadone UR: NOT DETECTED
POC Methamphetamine UR: NOT DETECTED
POC Morphine: NOT DETECTED
POC Oxazepam (BZO): NOT DETECTED
POC Oxycodone UR: NOT DETECTED
POC Secobarbital (BAR): NOT DETECTED

## 2020-08-11 LAB — CBC WITH DIFFERENTIAL/PLATELET
Abs Immature Granulocytes: 0.02 10*3/uL (ref 0.00–0.07)
Basophils Absolute: 0 10*3/uL (ref 0.0–0.1)
Basophils Relative: 0 %
Eosinophils Absolute: 0 10*3/uL (ref 0.0–0.5)
Eosinophils Relative: 1 %
HCT: 39.9 % (ref 36.0–46.0)
Hemoglobin: 13.8 g/dL (ref 12.0–15.0)
Immature Granulocytes: 0 %
Lymphocytes Relative: 21 %
Lymphs Abs: 1.1 10*3/uL (ref 0.7–4.0)
MCH: 30.9 pg (ref 26.0–34.0)
MCHC: 34.6 g/dL (ref 30.0–36.0)
MCV: 89.5 fL (ref 80.0–100.0)
Monocytes Absolute: 0.7 10*3/uL (ref 0.1–1.0)
Monocytes Relative: 13 %
Neutro Abs: 3.3 10*3/uL (ref 1.7–7.7)
Neutrophils Relative %: 65 %
Platelets: 190 10*3/uL (ref 150–400)
RBC: 4.46 MIL/uL (ref 3.87–5.11)
RDW: 12.7 % (ref 11.5–15.5)
WBC: 5.1 10*3/uL (ref 4.0–10.5)
nRBC: 0 % (ref 0.0–0.2)

## 2020-08-11 LAB — RESP PANEL BY RT-PCR (FLU A&B, COVID) ARPGX2
Influenza A by PCR: NEGATIVE
Influenza B by PCR: NEGATIVE
SARS Coronavirus 2 by RT PCR: NEGATIVE

## 2020-08-11 LAB — LIPID PANEL
Cholesterol: 162 mg/dL (ref 0–200)
HDL: 82 mg/dL (ref >40)
LDL Cholesterol: 69 mg/dL (ref 0–99)
Total CHOL/HDL Ratio: 2 RATIO
Triglycerides: 54 mg/dL (ref <150)
VLDL: 11 mg/dL (ref 0–40)

## 2020-08-11 LAB — HEMOGLOBIN A1C
Hgb A1c MFr Bld: 5.2 % (ref 4.8–5.6)
Mean Plasma Glucose: 102.54 mg/dL

## 2020-08-11 LAB — POCT PREGNANCY, URINE: Preg Test, Ur: NEGATIVE

## 2020-08-11 LAB — ETHANOL: Alcohol, Ethyl (B): <10 mg/dL (ref <10)

## 2020-08-11 LAB — POC SARS CORONAVIRUS 2 AG: SARS Coronavirus 2 Ag: NEGATIVE

## 2020-08-11 LAB — TSH: TSH: 2.724 u[IU]/mL (ref 0.350–4.500)

## 2020-08-11 MED ORDER — ALUM & MAG HYDROXIDE-SIMETH 200-200-20 MG/5ML PO SUSP: 30.0000 mL | ORAL | Status: DC | PRN

## 2020-08-11 MED ORDER — TRAZODONE HCL 50 MG PO TABS: 50.0000 mg | ORAL_TABLET | Freq: Every evening | ORAL | Status: DC | PRN

## 2020-08-11 MED ORDER — DULOXETINE HCL 20 MG PO CPEP
20.0000 mg | ORAL_CAPSULE | Freq: Every day | ORAL | Status: DC
  Administered 2020-08-11 – 2020-08-12 (×2): 20 mg via ORAL
  Filled 2020-08-11 (×2): qty 1

## 2020-08-11 MED ORDER — GABAPENTIN 100 MG PO CAPS
100.0000 mg | ORAL_CAPSULE | Freq: Three times a day (TID) | ORAL | Status: DC
  Administered 2020-08-11 – 2020-08-12 (×3): 100 mg via ORAL
  Filled 2020-08-11 (×3): qty 1

## 2020-08-11 MED ORDER — ACETAMINOPHEN 325 MG PO TABS: 650.0000 mg | ORAL_TABLET | Freq: Four times a day (QID) | ORAL | Status: DC | PRN

## 2020-08-11 MED ORDER — MAGNESIUM HYDROXIDE 400 MG/5ML PO SUSP: 30.0000 mL | Freq: Every day | ORAL | Status: DC | PRN

## 2020-08-11 MED ORDER — GABAPENTIN 100 MG PO CAPS
ORAL_CAPSULE | ORAL | Status: AC
  Administered 2020-08-11: 100 mg
  Filled 2020-08-11: qty 1

## 2020-08-11 MED ORDER — HYDROXYZINE HCL 25 MG PO TABS: 25.0000 mg | ORAL_TABLET | Freq: Three times a day (TID) | ORAL | Status: DC | PRN
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
## 2020-08-11 NOTE — ED Notes (Signed)
Pt given meal

## 2020-08-11 NOTE — ED Notes (Signed)
Pt offered dinner; declined
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
## 2020-08-12 MED ORDER — DULOXETINE HCL 20 MG PO CPEP: 20.0000 mg | ORAL_CAPSULE | Freq: Every day | ORAL | 0 refills | Status: AC

## 2020-08-12 MED ORDER — GABAPENTIN 100 MG PO CAPS: 100.0000 mg | ORAL_CAPSULE | Freq: Three times a day (TID) | ORAL | 0 refills | Status: AC

## 2020-08-12 NOTE — ED Notes (Signed)
Pt informed of soda / juice / coffee limit of 3  
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
## 2020-08-12 NOTE — ED Notes (Signed)
Patient A&O x 4, ambulatory. Patient discharged in no acute distress. Patient denied SI/HI, A/VH upon discharge. Patient verbalized understanding of all discharge instructions explained by staff, to include follow up appointments, RX's and safety plan. Pt belongings returned to pt intact from locker #31. Patient escorted to lobby via staff for transport to destination. Safety maintained.

## 2020-08-12 NOTE — ED Notes (Signed)
Breakfast given: banana nut muffin

## 2020-08-12 NOTE — Discharge Instructions (Addendum)

## 2020-08-12 NOTE — ED Notes (Signed)
Pt up ambulating in the room. Safety maintained and will continue to monitor.

## 2020-08-12 NOTE — ED Notes (Signed)
Discharge instructions provided, Pt stated understanding and Safe Transport called.
